# Patient Record
Sex: Male | Born: 1935 | Race: White | Hispanic: No | Marital: Married | Smoking: Former smoker
Health system: Southern US, Community
[De-identification: ages and names within clinical notes are randomized; demographics above are authoritative.]

## PROBLEM LIST (undated history)

## (undated) DIAGNOSIS — I2510 Atherosclerotic heart disease of native coronary artery without angina pectoris: Secondary | ICD-10-CM

## (undated) DIAGNOSIS — F0280 Dementia in other diseases classified elsewhere without behavioral disturbance: Secondary | ICD-10-CM

## (undated) DIAGNOSIS — K635 Polyp of colon: Secondary | ICD-10-CM

## (undated) DIAGNOSIS — K5730 Diverticulosis of large intestine without perforation or abscess without bleeding: Secondary | ICD-10-CM

## (undated) DIAGNOSIS — K922 Gastrointestinal hemorrhage, unspecified: Secondary | ICD-10-CM

## (undated) DIAGNOSIS — K648 Other hemorrhoids: Secondary | ICD-10-CM

## (undated) DIAGNOSIS — K5790 Diverticulosis of intestine, part unspecified, without perforation or abscess without bleeding: Secondary | ICD-10-CM

## (undated) DIAGNOSIS — I714 Abdominal aortic aneurysm, without rupture, unspecified: Secondary | ICD-10-CM

## (undated) DIAGNOSIS — N400 Enlarged prostate without lower urinary tract symptoms: Secondary | ICD-10-CM

## (undated) DIAGNOSIS — K219 Gastro-esophageal reflux disease without esophagitis: Secondary | ICD-10-CM

## (undated) DIAGNOSIS — K469 Unspecified abdominal hernia without obstruction or gangrene: Secondary | ICD-10-CM

## (undated) DIAGNOSIS — I509 Heart failure, unspecified: Secondary | ICD-10-CM

## (undated) DIAGNOSIS — I255 Ischemic cardiomyopathy: Secondary | ICD-10-CM

## (undated) DIAGNOSIS — G309 Alzheimer's disease, unspecified: Principal | ICD-10-CM

## (undated) DIAGNOSIS — E785 Hyperlipidemia, unspecified: Secondary | ICD-10-CM

## (undated) DIAGNOSIS — I459 Conduction disorder, unspecified: Secondary | ICD-10-CM

## (undated) DIAGNOSIS — C189 Malignant neoplasm of colon, unspecified: Secondary | ICD-10-CM

## (undated) HISTORY — DX: Hyperlipidemia, unspecified: E78.5

## (undated) HISTORY — DX: Other hemorrhoids: K64.8

## (undated) HISTORY — DX: Malignant neoplasm of colon, unspecified: C18.9

## (undated) HISTORY — PX: CHOLECYSTECTOMY: SHX55

## (undated) HISTORY — DX: Unspecified abdominal hernia without obstruction or gangrene: K46.9

## (undated) HISTORY — DX: Diverticulosis of intestine, part unspecified, without perforation or abscess without bleeding: K57.90

## (undated) HISTORY — DX: Atherosclerotic heart disease of native coronary artery without angina pectoris: I25.10

## (undated) HISTORY — DX: Ischemic cardiomyopathy: I25.5

## (undated) HISTORY — PX: OTHER SURGICAL HISTORY: SHX169

## (undated) HISTORY — DX: Alzheimer's disease, unspecified: G30.9

## (undated) HISTORY — DX: Polyp of colon: K63.5

## (undated) HISTORY — DX: Abdominal aortic aneurysm, without rupture, unspecified: I71.40

## (undated) HISTORY — DX: Heart failure, unspecified: I50.9

## (undated) HISTORY — DX: Conduction disorder, unspecified: I45.9

## (undated) HISTORY — PX: CARDIAC CATHETERIZATION: SHX172

## (undated) HISTORY — DX: Gastro-esophageal reflux disease without esophagitis: K21.9

## (undated) HISTORY — DX: Dementia in other diseases classified elsewhere, unspecified severity, without behavioral disturbance, psychotic disturbance, mood disturbance, and anxiety: F02.80

## (undated) HISTORY — DX: Benign prostatic hyperplasia without lower urinary tract symptoms: N40.0

## (undated) HISTORY — DX: Abdominal aortic aneurysm, without rupture: I71.4

## (undated) HISTORY — DX: Gastrointestinal hemorrhage, unspecified: K92.2

## (undated) HISTORY — DX: Diverticulosis of large intestine without perforation or abscess without bleeding: K57.30

## (undated) HISTORY — PX: INSERT / REPLACE / REMOVE PACEMAKER: SUR710

---

## 1991-12-13 DIAGNOSIS — C189 Malignant neoplasm of colon, unspecified: Secondary | ICD-10-CM

## 1991-12-13 HISTORY — DX: Malignant neoplasm of colon, unspecified: C18.9

## 1991-12-13 HISTORY — PX: COLON RESECTION: SHX5231

## 1995-12-13 HISTORY — PX: CORONARY ARTERY BYPASS GRAFT: SHX141

## 1996-02-10 HISTORY — PX: PACEMAKER INSERTION: SHX728

## 1999-10-06 ENCOUNTER — Encounter (INDEPENDENT_AMBULATORY_CARE_PROVIDER_SITE_OTHER): Payer: Self-pay | Admitting: Specialist

## 1999-10-06 ENCOUNTER — Other Ambulatory Visit: Admission: RE | Admit: 1999-10-06 | Discharge: 1999-10-06 | Payer: Self-pay | Admitting: Gastroenterology

## 1999-12-13 HISTORY — PX: OTHER SURGICAL HISTORY: SHX169

## 2003-03-13 HISTORY — PX: PROSTATE BIOPSY: SHX241

## 2004-11-05 ENCOUNTER — Ambulatory Visit: Payer: Self-pay

## 2004-12-16 ENCOUNTER — Ambulatory Visit: Payer: Self-pay

## 2005-01-04 ENCOUNTER — Ambulatory Visit: Payer: Self-pay

## 2005-02-02 ENCOUNTER — Ambulatory Visit: Payer: Self-pay | Admitting: Internal Medicine

## 2005-03-04 ENCOUNTER — Ambulatory Visit: Payer: Self-pay | Admitting: Internal Medicine

## 2005-03-21 ENCOUNTER — Ambulatory Visit: Payer: Self-pay

## 2005-04-01 ENCOUNTER — Ambulatory Visit: Payer: Self-pay | Admitting: Internal Medicine

## 2005-05-06 ENCOUNTER — Ambulatory Visit: Payer: Self-pay | Admitting: Internal Medicine

## 2005-05-12 HISTORY — PX: OTHER SURGICAL HISTORY: SHX169

## 2005-05-25 ENCOUNTER — Encounter: Payer: Self-pay | Admitting: Internal Medicine

## 2005-05-25 ENCOUNTER — Ambulatory Visit: Payer: Self-pay

## 2005-05-25 ENCOUNTER — Ambulatory Visit: Payer: Self-pay | Admitting: Internal Medicine

## 2005-05-31 ENCOUNTER — Inpatient Hospital Stay (HOSPITAL_COMMUNITY): Admission: RE | Admit: 2005-05-31 | Discharge: 2005-06-02 | Payer: Self-pay | Admitting: Internal Medicine

## 2005-06-01 ENCOUNTER — Ambulatory Visit: Payer: Self-pay | Admitting: *Deleted

## 2005-06-15 ENCOUNTER — Ambulatory Visit: Payer: Self-pay

## 2005-06-15 ENCOUNTER — Ambulatory Visit: Payer: Self-pay | Admitting: Internal Medicine

## 2005-09-27 ENCOUNTER — Ambulatory Visit: Payer: Self-pay | Admitting: Internal Medicine

## 2005-10-12 ENCOUNTER — Ambulatory Visit: Payer: Self-pay

## 2005-10-13 ENCOUNTER — Ambulatory Visit: Payer: Self-pay | Admitting: Internal Medicine

## 2005-11-11 ENCOUNTER — Ambulatory Visit: Payer: Self-pay | Admitting: Cardiovascular Disease

## 2005-12-27 ENCOUNTER — Ambulatory Visit: Payer: Self-pay | Admitting: Gastroenterology

## 2005-12-28 ENCOUNTER — Ambulatory Visit: Payer: Self-pay | Admitting: Internal Medicine

## 2006-01-18 ENCOUNTER — Encounter (INDEPENDENT_AMBULATORY_CARE_PROVIDER_SITE_OTHER): Payer: Self-pay | Admitting: Specialist

## 2006-01-18 ENCOUNTER — Ambulatory Visit: Payer: Self-pay | Admitting: Gastroenterology

## 2006-03-31 ENCOUNTER — Ambulatory Visit: Payer: Self-pay | Admitting: Internal Medicine

## 2006-04-14 ENCOUNTER — Ambulatory Visit: Payer: Self-pay | Admitting: Internal Medicine

## 2006-05-01 ENCOUNTER — Ambulatory Visit: Payer: Self-pay | Admitting: Internal Medicine

## 2006-05-04 ENCOUNTER — Ambulatory Visit: Payer: Self-pay | Admitting: Internal Medicine

## 2006-07-26 ENCOUNTER — Ambulatory Visit: Payer: Self-pay | Admitting: Internal Medicine

## 2006-08-11 ENCOUNTER — Ambulatory Visit: Payer: Self-pay

## 2006-08-17 ENCOUNTER — Ambulatory Visit: Payer: Self-pay | Admitting: Cardiology

## 2006-09-01 ENCOUNTER — Ambulatory Visit: Payer: Self-pay | Admitting: Internal Medicine

## 2006-11-11 HISTORY — PX: OTHER SURGICAL HISTORY: SHX169

## 2006-11-21 ENCOUNTER — Ambulatory Visit: Payer: Self-pay | Admitting: Internal Medicine

## 2006-11-30 ENCOUNTER — Ambulatory Visit: Payer: Self-pay

## 2006-12-03 IMAGING — CT CT CHEST W/O CM
1 series · 15 of 33 positions shown, 19 images · IV contrast (agent unspecified)
Comparison: none

CLINICAL DATA: Question right perihilar nodule on [REDACTED] chest x-ray 06/02/05.  Replacement coronary artery stent and AICD.  History of CAD/CABG. 
 CHEST CT WITHOUT CONTRAST:
TECHNIQUE: Multidetector CT imaging of the chest was performed following the standard protocol without IV contrast.

[Series 2: routine chest · axial · 0.74mm/px · z∈[-384,-110]mm · 15 of 65 slices shown, 19 images]
[im 5/65  mediastinal]
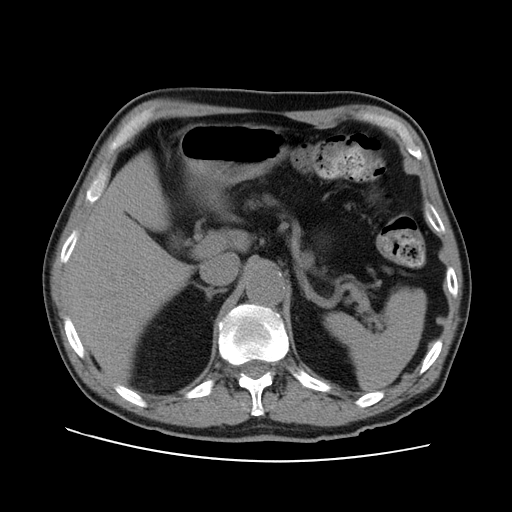
[im 5/65  lung]
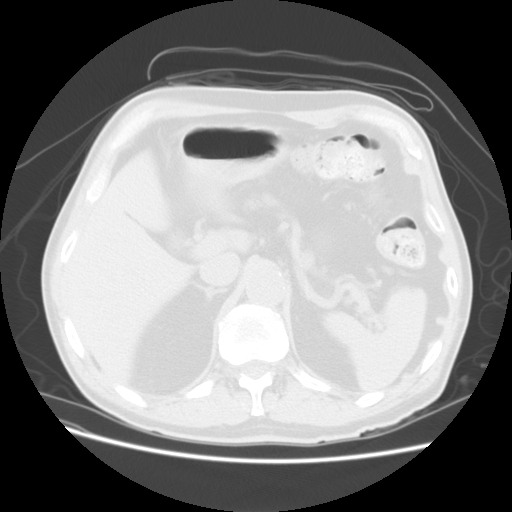
[im 10/65  lung]
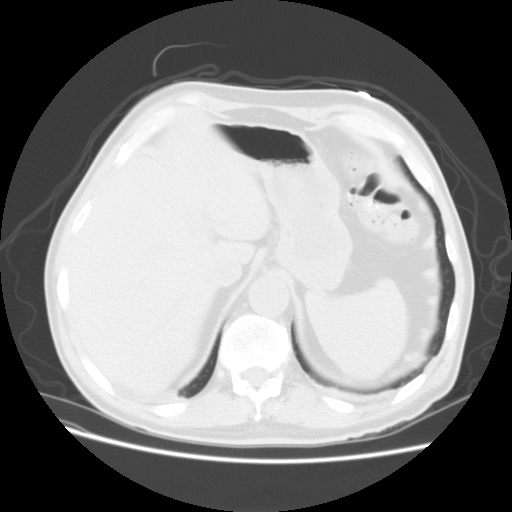
[im 13/65  lung]
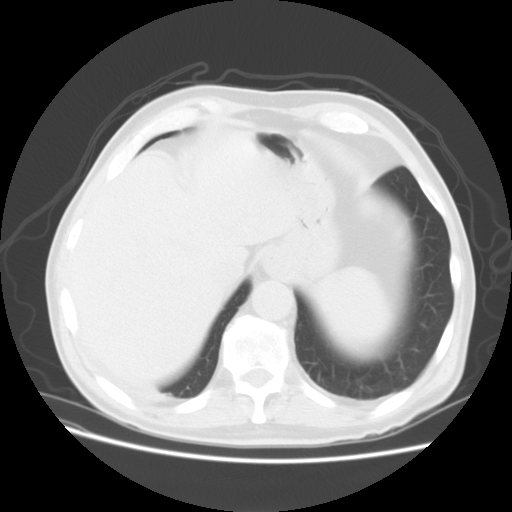
[im 17/65  lung]
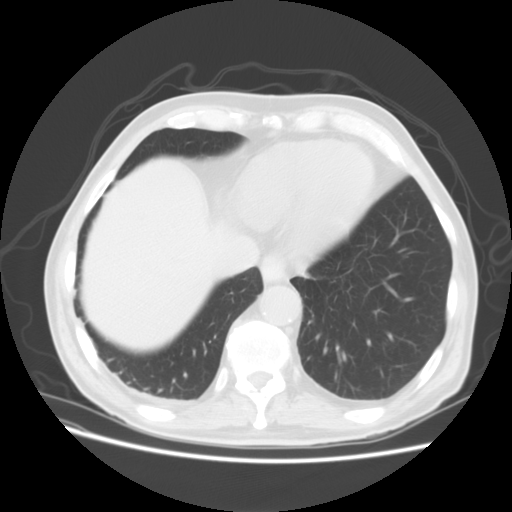
[im 22/65  mediastinal]
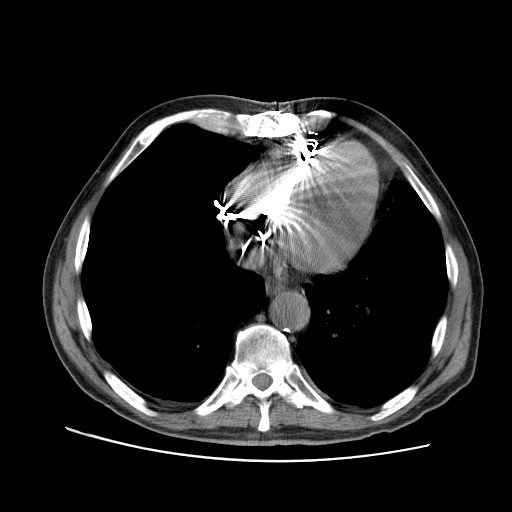
[im 22/65  lung]
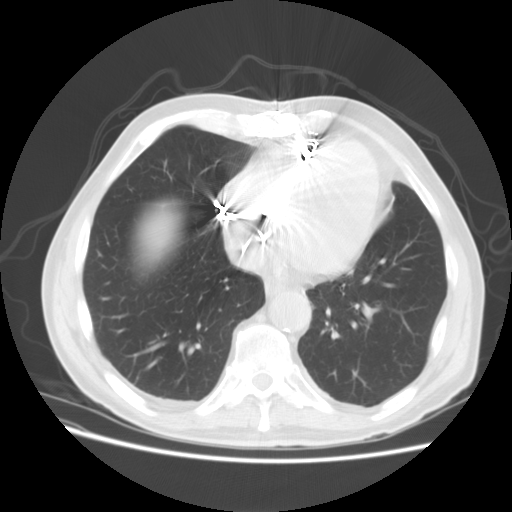
[im 26/65  lung]
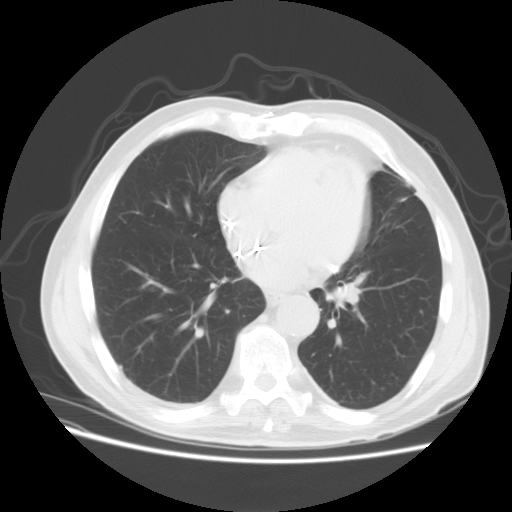
[im 29/65  lung]
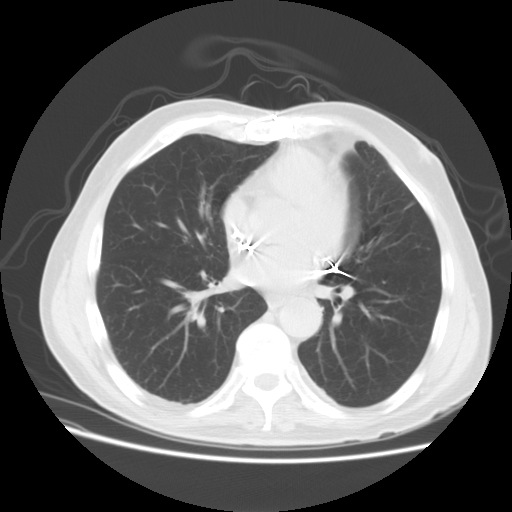
[im 34/65  lung]
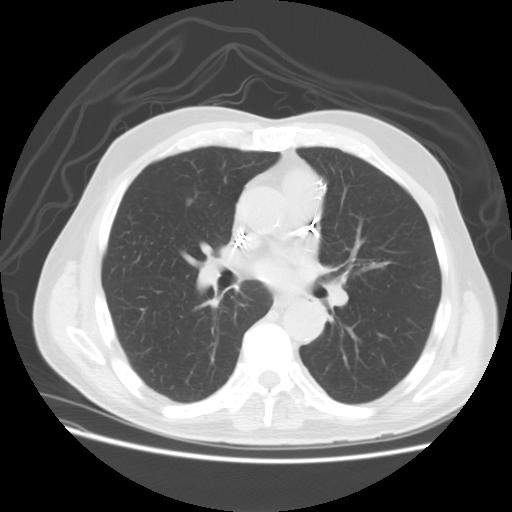
[im 36/65  mediastinal]
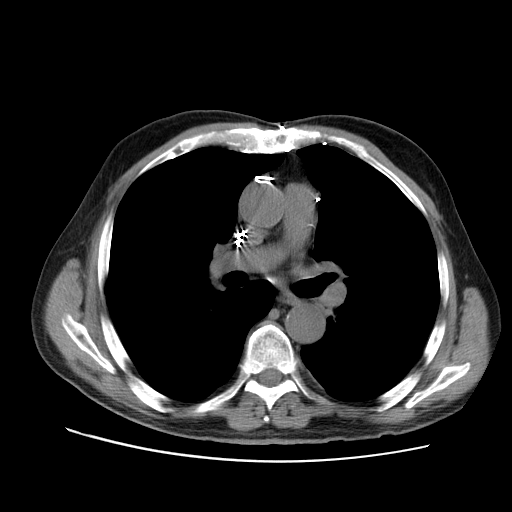
[im 36/65  lung]
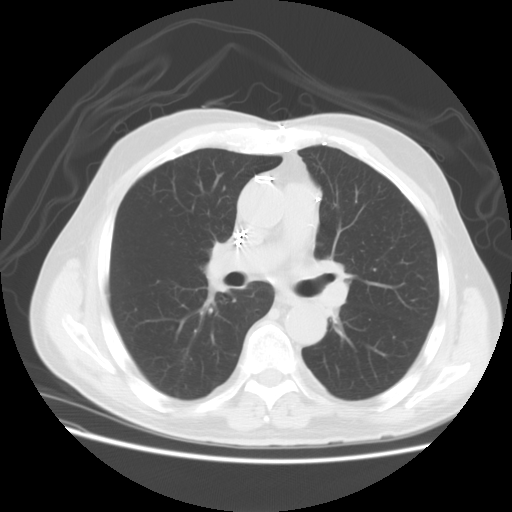
[im 39/65  lung]
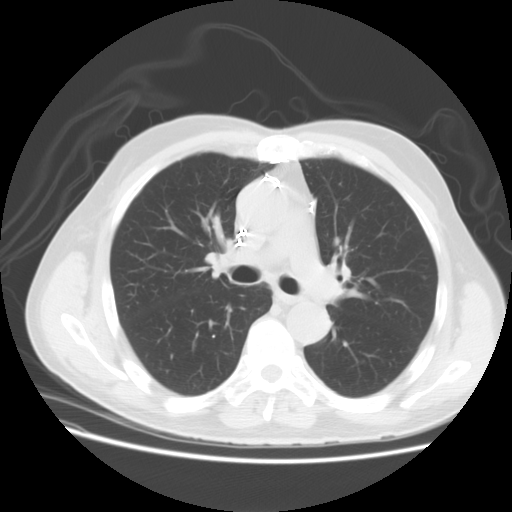
[im 43/65  lung]
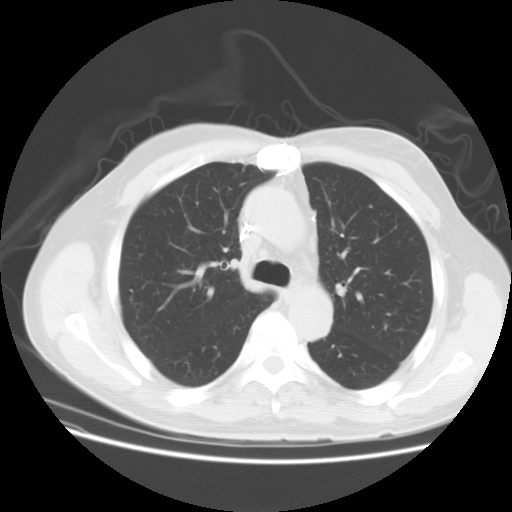
[im 48/65  lung]
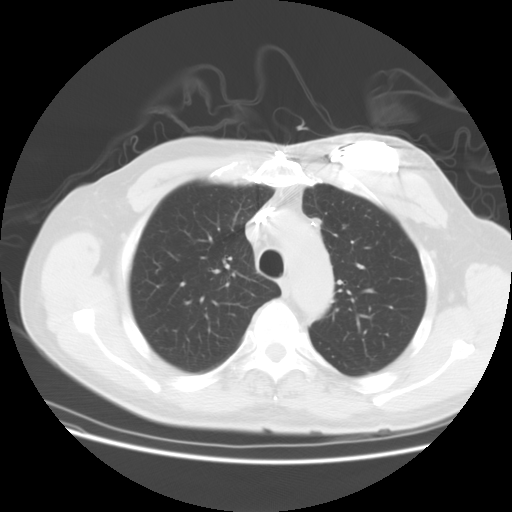
[im 52/65  mediastinal]
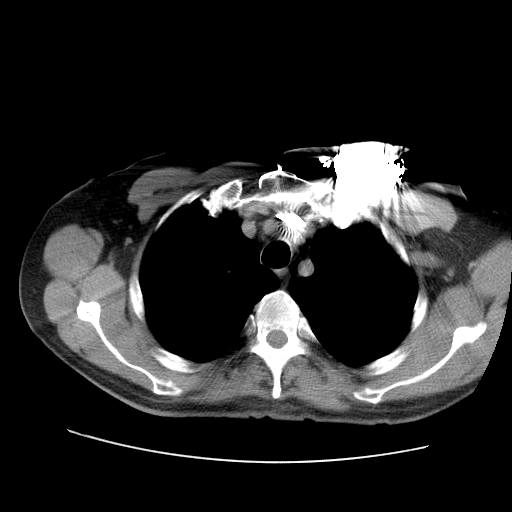
[im 52/65  lung]
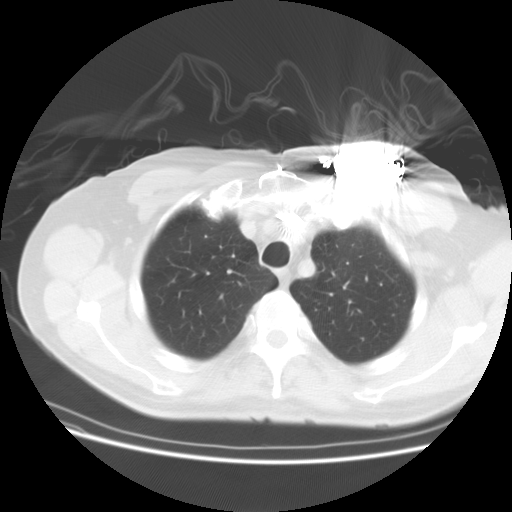
[im 55/65  lung]
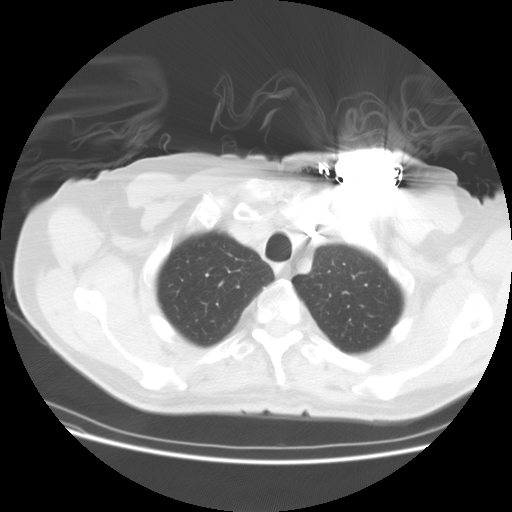
[im 60/65  lung]
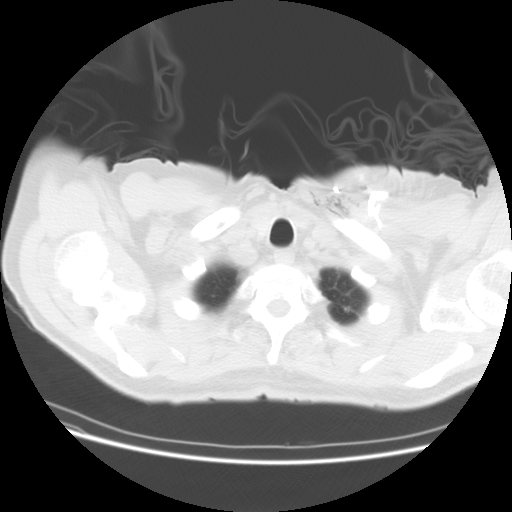

[15 of 33 positions shown; findings below may reference images not displayed]

FINDINGS: Multidetector spiral axial images were obtained through the thorax with no IV contrast and comparison made with [REDACTED] chest x-ray 06/02/05.  Since that study, a solitary ovoid 7 mm noncalcified nodule is seen along the level of the minor fissure at junction of right upper and middle lobes.  This could represent noncalcified pulmonary nodule or fissural lymph node.  Six-month follow-up chest CT, no IV contrast, is advised for further confirmation.  Lungs are otherwise clear.  No mediastinal, hilar, nor axillary mass/adenopathy is seen.  Stable heart size, coronary artery stent, post CABG changes, and permanent cardiac pacemaker/AICD leads are seen as in chest x-ray.  Minimal pleural thickening is seen at the bilateral lung bases.  Upper abdominal organs are unremarkable.
IMPRESSION: Since [REDACTED] chest x-ray, 06/02/05:
 1.  6 mm noncalcified nodule or fissural lymph node at the junction of right upper and middle lobes ? recommend limited chest CT, no IV contrast, in six months for further evaluation.
 2.  Post CABG, coronary arterial stent placement, and AICD/cardiac pacemaker leads.
 3.  Slight posterior pleural thickening with punctate benign-appearing calcifications (image #48).
 4.  11 mm likely low density adenoma in otherwise normal thyroid gland (image #4).

## 2006-12-12 HISTORY — PX: CORONARY ANGIOPLASTY: SHX604

## 2006-12-13 ENCOUNTER — Ambulatory Visit: Payer: Self-pay | Admitting: Internal Medicine

## 2006-12-14 ENCOUNTER — Inpatient Hospital Stay (HOSPITAL_BASED_OUTPATIENT_CLINIC_OR_DEPARTMENT_OTHER): Admission: RE | Admit: 2006-12-14 | Discharge: 2006-12-14 | Payer: Self-pay | Admitting: Cardiovascular Disease

## 2006-12-14 ENCOUNTER — Ambulatory Visit: Payer: Self-pay | Admitting: Cardiovascular Disease

## 2006-12-25 ENCOUNTER — Ambulatory Visit: Payer: Self-pay | Admitting: Internal Medicine

## 2007-04-02 ENCOUNTER — Ambulatory Visit: Payer: Self-pay

## 2007-04-26 DIAGNOSIS — N400 Enlarged prostate without lower urinary tract symptoms: Secondary | ICD-10-CM

## 2007-04-26 DIAGNOSIS — K219 Gastro-esophageal reflux disease without esophagitis: Secondary | ICD-10-CM

## 2007-04-26 DIAGNOSIS — I714 Abdominal aortic aneurysm, without rupture, unspecified: Secondary | ICD-10-CM | POA: Insufficient documentation

## 2007-04-26 DIAGNOSIS — K5730 Diverticulosis of large intestine without perforation or abscess without bleeding: Secondary | ICD-10-CM | POA: Insufficient documentation

## 2007-04-26 DIAGNOSIS — C180 Malignant neoplasm of cecum: Secondary | ICD-10-CM

## 2007-05-14 ENCOUNTER — Ambulatory Visit: Payer: Self-pay | Admitting: Internal Medicine

## 2007-05-14 DIAGNOSIS — E785 Hyperlipidemia, unspecified: Secondary | ICD-10-CM | POA: Insufficient documentation

## 2007-05-15 LAB — CONVERTED CEMR LAB
Albumin: 3.3 g/dL — ABNORMAL LOW (ref 3.5–5.2)
BUN: 12 mg/dL (ref 6–23)
CO2: 31 meq/L (ref 19–32)
Calcium: 8.9 mg/dL (ref 8.4–10.5)
Chloride: 104 meq/L (ref 96–112)
Cholesterol: 176 mg/dL (ref 0–200)
Creatinine, Ser: 1 mg/dL (ref 0.4–1.5)
GFR calc Af Amer: 94 mL/min
GFR calc non Af Amer: 78 mL/min
Glucose, Bld: 108 mg/dL — ABNORMAL HIGH (ref 70–99)
HDL: 28.8 mg/dL — ABNORMAL LOW (ref >39.0)
LDL Cholesterol: 128 mg/dL — ABNORMAL HIGH (ref 0–99)
PSA: 2.83 ng/mL (ref 0.10–4.00)
Phosphorus: 3.1 mg/dL (ref 2.3–4.6)
Potassium: 5 meq/L (ref 3.5–5.1)
Sodium: 136 meq/L (ref 135–145)
Total CHOL/HDL Ratio: 6.1
Triglycerides: 95 mg/dL (ref 0–149)
VLDL: 19 mg/dL (ref 0–40)

## 2007-07-02 ENCOUNTER — Ambulatory Visit: Payer: Self-pay | Admitting: Internal Medicine

## 2007-07-04 ENCOUNTER — Encounter: Payer: Self-pay | Admitting: Internal Medicine

## 2007-08-20 ENCOUNTER — Ambulatory Visit: Payer: Self-pay

## 2007-10-08 ENCOUNTER — Encounter: Payer: Self-pay | Admitting: Internal Medicine

## 2007-10-31 ENCOUNTER — Ambulatory Visit: Payer: Self-pay | Admitting: Family Medicine

## 2008-01-09 ENCOUNTER — Encounter: Payer: Self-pay | Admitting: Internal Medicine

## 2008-01-18 ENCOUNTER — Ambulatory Visit: Payer: Self-pay | Admitting: Internal Medicine

## 2008-04-16 ENCOUNTER — Encounter: Payer: Self-pay | Admitting: Internal Medicine

## 2008-05-12 ENCOUNTER — Ambulatory Visit: Payer: Self-pay | Admitting: Internal Medicine

## 2008-07-07 ENCOUNTER — Ambulatory Visit: Payer: Self-pay | Admitting: Internal Medicine

## 2008-07-09 LAB — CONVERTED CEMR LAB
ALT: 18 units/L (ref 0–53)
AST: 24 units/L (ref 0–37)
Albumin: 3.9 g/dL (ref 3.5–5.2)
Alkaline Phosphatase: 74 units/L (ref 39–117)
Basophils Relative: 0.4 % (ref 0.0–3.0)
Calcium: 9.3 mg/dL (ref 8.4–10.5)
Creatinine, Ser: 0.9 mg/dL (ref 0.4–1.5)
Eosinophils Relative: 1.6 % (ref 0.0–5.0)
GFR calc Af Amer: 106 mL/min
GFR calc non Af Amer: 88 mL/min
LDL Cholesterol: 130 mg/dL — ABNORMAL HIGH (ref 0–99)
Lymphocytes Relative: 21.4 % (ref 12.0–46.0)
Neutrophils Relative %: 66.2 % (ref 43.0–77.0)
Phosphorus: 3.7 mg/dL (ref 2.3–4.6)
RBC: 4.33 M/uL (ref 4.22–5.81)
Total Protein: 7.1 g/dL (ref 6.0–8.3)
VLDL: 21 mg/dL (ref 0–40)
WBC: 6.7 10*3/uL (ref 4.5–10.5)

## 2008-08-04 ENCOUNTER — Ambulatory Visit: Payer: Self-pay | Admitting: Internal Medicine

## 2008-08-22 ENCOUNTER — Ambulatory Visit: Payer: Self-pay

## 2008-10-09 ENCOUNTER — Telehealth: Payer: Self-pay | Admitting: Internal Medicine

## 2008-10-20 ENCOUNTER — Ambulatory Visit: Payer: Self-pay | Admitting: Internal Medicine

## 2008-10-29 ENCOUNTER — Encounter: Payer: Self-pay | Admitting: Internal Medicine

## 2008-10-31 ENCOUNTER — Ambulatory Visit: Payer: Self-pay | Admitting: Internal Medicine

## 2008-11-05 ENCOUNTER — Telehealth (INDEPENDENT_AMBULATORY_CARE_PROVIDER_SITE_OTHER): Payer: Self-pay | Admitting: Internal Medicine

## 2009-01-19 ENCOUNTER — Ambulatory Visit: Payer: Self-pay | Admitting: Internal Medicine

## 2009-03-09 ENCOUNTER — Telehealth (INDEPENDENT_AMBULATORY_CARE_PROVIDER_SITE_OTHER): Payer: Self-pay | Admitting: *Deleted

## 2009-03-10 ENCOUNTER — Ambulatory Visit: Payer: Self-pay

## 2009-03-10 ENCOUNTER — Encounter: Payer: Self-pay | Admitting: Cardiology

## 2009-04-27 ENCOUNTER — Ambulatory Visit: Payer: Self-pay | Admitting: Internal Medicine

## 2009-04-27 ENCOUNTER — Encounter: Payer: Self-pay | Admitting: Internal Medicine

## 2009-04-29 ENCOUNTER — Encounter: Payer: Self-pay | Admitting: Internal Medicine

## 2009-05-06 ENCOUNTER — Ambulatory Visit: Payer: Self-pay | Admitting: Internal Medicine

## 2009-05-06 ENCOUNTER — Encounter: Payer: Self-pay | Admitting: Internal Medicine

## 2009-05-15 ENCOUNTER — Telehealth: Payer: Self-pay | Admitting: Internal Medicine

## 2009-05-19 ENCOUNTER — Telehealth: Payer: Self-pay | Admitting: Internal Medicine

## 2009-06-09 ENCOUNTER — Telehealth: Payer: Self-pay | Admitting: Internal Medicine

## 2009-08-10 ENCOUNTER — Ambulatory Visit: Payer: Self-pay | Admitting: Internal Medicine

## 2009-08-10 DIAGNOSIS — I442 Atrioventricular block, complete: Secondary | ICD-10-CM

## 2009-08-24 ENCOUNTER — Telehealth: Payer: Self-pay | Admitting: Internal Medicine

## 2009-08-26 ENCOUNTER — Ambulatory Visit: Payer: Self-pay

## 2009-08-26 ENCOUNTER — Encounter: Payer: Self-pay | Admitting: Internal Medicine

## 2009-09-03 ENCOUNTER — Ambulatory Visit: Payer: Self-pay | Admitting: Internal Medicine

## 2009-09-04 LAB — CONVERTED CEMR LAB
Albumin: 3.8 g/dL (ref 3.5–5.2)
Basophils Relative: 0.6 % (ref 0.0–3.0)
Bilirubin, Direct: 0 mg/dL (ref 0.0–0.3)
Calcium: 9.5 mg/dL (ref 8.4–10.5)
Eosinophils Absolute: 0.1 10*3/uL (ref 0.0–0.7)
Glucose, Bld: 81 mg/dL (ref 70–99)
MCHC: 34.5 g/dL (ref 30.0–36.0)
MCV: 98.2 fL (ref 78.0–100.0)
Monocytes Absolute: 0.8 10*3/uL (ref 0.1–1.0)
Neutro Abs: 3.9 10*3/uL (ref 1.4–7.7)
Neutrophils Relative %: 60.7 % (ref 43.0–77.0)
Potassium: 4.8 meq/L (ref 3.5–5.1)
RBC: 4.45 M/uL (ref 4.22–5.81)
TSH: 0.48 microintl units/mL (ref 0.35–5.50)
Total Protein: 6.9 g/dL (ref 6.0–8.3)
Vitamin B-12: 330 pg/mL (ref 211–911)

## 2009-09-08 ENCOUNTER — Telehealth: Payer: Self-pay | Admitting: Internal Medicine

## 2009-09-08 ENCOUNTER — Telehealth (INDEPENDENT_AMBULATORY_CARE_PROVIDER_SITE_OTHER): Payer: Self-pay | Admitting: *Deleted

## 2009-09-18 ENCOUNTER — Ambulatory Visit: Payer: Self-pay | Admitting: Internal Medicine

## 2009-10-19 ENCOUNTER — Ambulatory Visit: Payer: Self-pay | Admitting: Internal Medicine

## 2009-11-09 ENCOUNTER — Ambulatory Visit: Payer: Self-pay | Admitting: Internal Medicine

## 2009-11-17 ENCOUNTER — Ambulatory Visit: Payer: Self-pay | Admitting: Internal Medicine

## 2009-12-21 ENCOUNTER — Telehealth: Payer: Self-pay | Admitting: Internal Medicine

## 2010-01-06 ENCOUNTER — Telehealth: Payer: Self-pay | Admitting: Internal Medicine

## 2010-01-22 ENCOUNTER — Ambulatory Visit: Payer: Self-pay | Admitting: Internal Medicine

## 2010-03-01 ENCOUNTER — Ambulatory Visit: Payer: Self-pay | Admitting: Internal Medicine

## 2010-03-03 ENCOUNTER — Encounter: Payer: Self-pay | Admitting: Internal Medicine

## 2010-03-04 ENCOUNTER — Telehealth (INDEPENDENT_AMBULATORY_CARE_PROVIDER_SITE_OTHER): Payer: Self-pay | Admitting: *Deleted

## 2010-03-08 ENCOUNTER — Ambulatory Visit: Payer: Self-pay | Admitting: Internal Medicine

## 2010-03-08 ENCOUNTER — Ambulatory Visit: Payer: Self-pay

## 2010-03-08 ENCOUNTER — Encounter (HOSPITAL_COMMUNITY): Admission: RE | Admit: 2010-03-08 | Discharge: 2010-05-12 | Payer: Self-pay | Admitting: Internal Medicine

## 2010-05-05 ENCOUNTER — Encounter: Payer: Self-pay | Admitting: Internal Medicine

## 2010-06-28 ENCOUNTER — Ambulatory Visit: Payer: Self-pay | Admitting: Internal Medicine

## 2010-07-05 ENCOUNTER — Encounter: Payer: Self-pay | Admitting: Internal Medicine

## 2010-07-21 ENCOUNTER — Ambulatory Visit: Payer: Self-pay | Admitting: Internal Medicine

## 2010-07-21 DIAGNOSIS — H9190 Unspecified hearing loss, unspecified ear: Secondary | ICD-10-CM | POA: Insufficient documentation

## 2010-08-31 ENCOUNTER — Encounter: Payer: Self-pay | Admitting: Internal Medicine

## 2010-09-01 ENCOUNTER — Encounter: Payer: Self-pay | Admitting: Internal Medicine

## 2010-09-01 ENCOUNTER — Ambulatory Visit: Payer: Self-pay

## 2010-09-23 ENCOUNTER — Ambulatory Visit: Payer: Self-pay | Admitting: Internal Medicine

## 2010-11-16 ENCOUNTER — Ambulatory Visit: Payer: Self-pay | Admitting: Internal Medicine

## 2010-11-18 ENCOUNTER — Encounter: Payer: Self-pay | Admitting: Internal Medicine

## 2010-11-18 ENCOUNTER — Emergency Department: Payer: PRIVATE HEALTH INSURANCE | Admitting: Emergency Medicine

## 2010-11-19 ENCOUNTER — Telehealth (INDEPENDENT_AMBULATORY_CARE_PROVIDER_SITE_OTHER): Payer: Self-pay | Admitting: *Deleted

## 2010-11-19 ENCOUNTER — Ambulatory Visit: Payer: Self-pay | Admitting: Internal Medicine

## 2010-11-19 DIAGNOSIS — K625 Hemorrhage of anus and rectum: Secondary | ICD-10-CM

## 2010-11-22 ENCOUNTER — Ambulatory Visit: Payer: Self-pay | Admitting: Internal Medicine

## 2010-11-22 ENCOUNTER — Telehealth: Payer: Self-pay | Admitting: Gastroenterology

## 2010-11-22 LAB — CONVERTED CEMR LAB
Basophils Relative: 0.4 % (ref 0.0–3.0)
Eosinophils Absolute: 0.1 10*3/uL (ref 0.0–0.7)
Eosinophils Relative: 1.8 % (ref 0.0–5.0)
Hemoglobin: 14.6 g/dL (ref 13.0–17.0)
Lymphocytes Relative: 16.2 % (ref 12.0–46.0)
Monocytes Relative: 10.8 % (ref 3.0–12.0)
Neutrophils Relative %: 70.8 % (ref 43.0–77.0)
RBC: 4.21 M/uL — ABNORMAL LOW (ref 4.22–5.81)
WBC: 6.8 10*3/uL (ref 4.5–10.5)

## 2010-11-23 ENCOUNTER — Ambulatory Visit: Payer: Self-pay | Admitting: Gastroenterology

## 2010-11-23 DIAGNOSIS — I2510 Atherosclerotic heart disease of native coronary artery without angina pectoris: Secondary | ICD-10-CM | POA: Insufficient documentation

## 2010-11-23 DIAGNOSIS — I10 Essential (primary) hypertension: Secondary | ICD-10-CM | POA: Insufficient documentation

## 2010-11-23 DIAGNOSIS — I509 Heart failure, unspecified: Secondary | ICD-10-CM | POA: Insufficient documentation

## 2011-01-02 ENCOUNTER — Encounter: Payer: Self-pay | Admitting: Internal Medicine

## 2011-01-03 ENCOUNTER — Encounter (INDEPENDENT_AMBULATORY_CARE_PROVIDER_SITE_OTHER): Payer: Self-pay

## 2011-01-05 ENCOUNTER — Ambulatory Visit
Admission: RE | Admit: 2011-01-05 | Discharge: 2011-01-05 | Payer: Self-pay | Source: Home / Self Care | Attending: Gastroenterology | Admitting: Gastroenterology

## 2011-01-11 NOTE — Assessment & Plan Note (Signed)
Summary: ER FOLLOW UP- per Dr.Letvak  [facility]/ lb   Vital Signs:  Patient profile:   77 year old male Weight:      150 pounds O2 Sat:      100 % on Room air Temp:     98.9 degrees F oral Pulse rate:   65 / minute Pulse rhythm:   regular BP sitting:   122 / 62  (left arm) Cuff size:   large  Vitals Entered By: Mervin Hack CMA Duncan Dull) (November 19, 2010 12:51 PM)  O2 Flow:  Room air CC: ER follow-up   History of Present Illness: Went to bathroom yesterday and moved bowels as he was wiping, he found bright red blood in bowl and on toilet paper he was dripping blood ----wife by then was there and she witnessed Went straight to ER and was still bleeding  Seen there Reports reviewed did have blood clots on exam  Did have repeat exam about 2 hours later and no blood and stool was even brown Hgb went from 14.0 to 13.1 over that 2 hours  No stomach pain  No history of diverticulosis  Allergies: 1)  ! Lipitor 2)  ! Zocor  Past History:  Past medical, surgical, family and social histories (including risk factors) reviewed for relevance to current acute and chronic problems.  Past Medical History: Reviewed history from 10/19/2009 and no changes required. Congestive heart failure Coronary artery disease Diverticulosis, colon GERD Benign prostatic hypertrophy--------------------------------------Dr Cope Heart block--pacer/defib 6/06--------------------------------------Dr Graciela Husbands CHF AAA Hyperlipidemia Colon cancer, hx of Mild cognitive impairment  Past Surgical History: Reviewed history from 09/03/2009 and no changes required. CABG  1997 RESECTION R COLON  1993 RECURRANT HYPERPLASTIC POLYPS Cholecystectomy CARDILITE  NEGATIVE  1/01 PACEMAKER 02/1996 PROSTATE BX  NEGITIVE  03/2003 ADENSONE STRESS- FIXED AMT DEFECT LV 30% (DILATED)  05/2005 PACER /ICD (TYLER) 05/2005 - ST Jude Atlas Plus ADENOSOINE-MYOVIEW- LOW RISK 11/2006 CATH --NO SIG PROBLEMS  12/2006  Family  History: Reviewed history from 09/03/2009 and no changes required. Mom and Dad died of old age Sister died of insulin reaction. 2 brothers--also deceased    Social History: Reviewed history from 09/03/2009 and no changes required. 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  NAD Neck:  supple, no masses, no thyromegaly, and no cervical lymphadenopathy.   Lungs:  normal respiratory effort, no intercostal retractions, no accessory muscle use, and normal breath sounds.   Heart:  normal rate, regular rhythm, no murmur, and no gallop.   Abdomen:  soft, non-tender, and no masses.   Rectal:  small non thrombosed ext hemorrhoid very slight irritation near that No masses Brown heme negative stool in vault   Impression & Recommendations:  Problem # 1:  RECTAL BLEEDING (ICD-569.3) Assessment New significant bleeding for over 30 minutes with blood clots on exam in ER Then heme negative stool no recurrence and stool still heme negative No external lesions to account for bleeding (and had clots in vault) this suggests a distal colonic lesion----diverticulosis most likely though none on colon in 2004  P: stay off aspirin and plavix    recheck 3-4 days    ER if recurrent bleeding    will plan on GI eval  Complete Medication List: 1)  Nadolol 20 Mg Tabs (Nadolol) .Marland Kitchen.. 1 once daily 2)  Aspir-low 81 Mg Tbec (Aspirin) .Marland Kitchen.. 1 once daily 3)  Plavix 75 Mg Tabs (Clopidogrel bisulfate) .... Take one by mouth daily 4)  Multivitamins Tabs (Multiple vitamin) .Marland Kitchen.. 1 daily 5)  Avodart 0.5 Mg Caps (Dutasteride) .Marland Kitchen.. 1 tab once daily  Patient Instructions: 1)  Please stay off the aspirin and plavix till your appt next week 2)  Please set up a follow up for Monday or Tuesday   Orders Added: 1)  Est. Patient Level IV  [16109]    Current Allergies (reviewed today): ! LIPITOR ! ZOCOR

## 2011-01-11 NOTE — Assessment & Plan Note (Signed)
Summary: F/U W/ DEVICE CHECK      Allergies Added:   Visit Type:  Follow-up Primary Provider:  Cindee Salt MD  CC:  no complaints and sometimes have little tingling in arm.  History of Present Illness: [name] Rich is seen in followup for complete heart block ischemic heart disease with prior bypass surgery and an implanted ICD for primary prevention. He has had intercurrent normalization of left ventricular systolic function. Her breath he comes in today complaining of new onset exertional left arm discomfort. Is not associated with radiation shortness of breath diaphoresis or nausea. It is relieved by rest.  Review of his prior data demonstrates a low risk Myoview in 2007.He underwent catheterization following that Myoview which demonstrated patent grafts..  His bypass was in 1997.    Current Problems (verified): 1)  Mild Cognitive Impairment So Stated  (ICD-331.83) 2)  Dementia  (ICD-294.8) 3)  Implantable Defibrillator St J  (ICD-V45.02) 4)  Cardiomyopathy, Ischemic S/p Cabg Nl Ef  (ICD-414.8) 5)  Av Block, Complete  (ICD-426.0) 6)  Hyperlipidemia  (ICD-272.4) 7)  Abdominal Aortic Aneurysm  (ICD-441.4) 8)  Adenocarcinoma, Colon, Cecum  (ICD-153.4) 9)  Benign Prostatic Hypertrophy  (ICD-600.00) 10)  Gerd  (ICD-530.81) 11)  Diverticulosis, Colon  (ICD-562.10) 12)  Colon Cancer, Hx of  (ICD-V10.05) 13)  Screening For Malignant Neoplasm, Prostate  (ICD-V76.44)  Current Medications (verified): 1)  Nadolol 20 Mg Tabs (Nadolol) .Marland Kitchen.. 1 Once Daily 2)  Aspir-Low 81 Mg Tbec (Aspirin) .Marland Kitchen.. 1 Once Daily 3)  Plavix 75 Mg Tabs (Clopidogrel Bisulfate) .Marland Kitchen.. 1 Daily 4)  Multivitamins  Tabs (Multiple Vitamin) .Marland Kitchen.. 1 Daily 5)  Avodart 0.5 Mg  Caps (Dutasteride) .... As Directed  Allergies (verified): 1)  ! Lipitor 2)  ! Zocor  Past History:  Past Medical History: Last updated: 10/19/2009 Congestive heart failure Coronary artery disease Diverticulosis, colon GERD Benign  prostatic hypertrophy--------------------------------------Dr Cope Heart block--pacer/defib 6/06--------------------------------------Dr Graciela Husbands CHF AAA Hyperlipidemia Colon cancer, hx of Mild cognitive impairment  Past Surgical History: Last updated: Sep 20, 2009 CABG  1997 RESECTION R COLON  1993 RECURRANT HYPERPLASTIC POLYPS Cholecystectomy CARDILITE  NEGATIVE  1/01 PACEMAKER 02/1996 PROSTATE BX  NEGITIVE  03/2003 ADENSONE STRESS- FIXED AMT DEFECT LV 30% (DILATED)  05/2005 PACER /ICD (TYLER) 05/2005 - ST Jude Atlas Plus ADENOSOINE-MYOVIEW- LOW RISK 11/2006 CATH --NO SIG PROBLEMS  12/2006  Family History: Last updated: September 20, 2009 Mom and Dad died of old age Sister died of insulin reaction. 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 Head:  normal HEENT Neck:  supple. Lungs:  Clear bilaterally to auscultation and percussion. Heart:  regular rate and rhythm with a diminished S1 prominent S4 and a 2/6 murmur heard along theleft sternal border Abdomen:  soft nontender with active bowel sounds Msk:  Back normal, normal gait. Muscle strength and tone normal. Pulses:  pulses normal  in all 4 extremities Neurologic:  alert and oriented but could not remember instructions from 10 minutes before    ICD Specifications Following MD:  Sherryl Manges, MD     ICD Vendor:  [facility] Jude     ICD Model Number:  [id number]     ICD Serial Number:   [id number] ICD DOI:  06/01/2005     ICD Implanting MD:  Sherryl Manges, MD  Lead 1:    Location: RA     DOI: 02/20/1996     Model #: [id number]     Serial #: [id number]     Status: active Lead 2:    Location: RV     DOI: 06/01/2005     Model #: [id number]     Serial #: [id number]     Status: active  Indications::  ICM  Explantation Comments: Pacemaker dependent  ICD Follow Up Remote Check?  No Battery Voltage:  2.58 V     Charge Time:  12 seconds     Underlying rhythm:  dependent ICD Dependent:  Yes       ICD Device Measurements Atrium:  Amplitude: 0.9 mV, Impedance: 280 ohms, Threshold: 1.0 V at 0.6 msec Right Ventricle:  Impedance: 580 ohms, Threshold: 0.5 V at 0.5 msec  Episodes MS Episodes:  268     Percent Mode Switch:  <1%     Coumadin:  No Shock:  0     ATP:  0     Nonsustained:  0     Atrial Pacing:  44%     Ventricular Pacing:  100%  Brady Parameters Mode DDDR     Lower Rate Limit:  60     Upper Rate Limit 120 PAV 170     Sensed AV Delay:  150  Tachy Zones VF:  200     VT:  164     Next Cardiology Appt Due:  05/12/2010 Tech Comments:  No parameter changes.  Device function normal.  ROV 3 months with Dr. Graciela Husbands in [place]. Altha Harm, LPN  March 01, 2010 9:42 AM   Impression & Recommendations:  Problem # 1:  CHEST PAIN (ICD-786.50) [name] has exertional left arm discomfort. While this is quite atypical, he is now 14 years post graft placement. He had negative catheterization about 3 years ago and had a Myoview prior to that. We will repeat a Myoview scan and we'll need to compare it carefully to the previous scan to look for evidence of new ischemia.  No medication changes will be made at His updated medication list for this problem includes:    Nadolol 20 Mg Tabs (Nadolol) .Marland Kitchen... 1 once daily    Aspir-low 81 Mg Tbec (Aspirin) .Marland Kitchen... 1 once daily    Plavix 75 Mg Tabs (Clopidogrel bisulfate) .Marland Kitchen... 1 daily  Problem # 2:  IMPLANTABLE  DEFIBRILLATOR  ST J (ICD-V45.02) Device parameters  and data were reviewed and no changes were made  Problem # 3:  CARDIOMYOPATHY, ISCHEMIC S/P CABG NL EF (ICD-414.8) as above  Problem # 4:  AV BLOCK, COMPLETE (ICD-426.0) stable post pacemaker implantation His updated medication list for this problem includes:    Nadolol 20 Mg Tabs (Nadolol) .Marland Kitchen... 1 once daily    Aspir-low 81 Mg Tbec (Aspirin) .Marland Kitchen... 1 once daily    Plavix 75 Mg Tabs (Clopidogrel bisulfate) .Marland Kitchen... 1 daily

## 2011-01-11 NOTE — Assessment & Plan Note (Signed)
Summary: Cardiology Nuclear Study  Nuclear Med Background Indications for Stress Test: Evaluation for Ischemia, Graft Patency, Stent Patency   History: Abnormal EKG, CABG, Defibrillator, Heart Catheterization, Myocardial Perfusion Study, Pacemaker, Stents  History Comments: '97 CABGx3 '06 Defibrillator (ICM) '06 STENTS SVG-OM2 '07 Myoview Perfusion Study: AntApical Scar with mild P-I Ischemia EF 57% '08 Heart Catherization: Patent Grafts/Stents EF 45-50%   Symptoms Comments: 02/10 L. Arm Pain while shoveling snow.   Nuclear Pre-Procedure Cardiac Risk Factors: Family History - CAD, History of Smoking, LBBB Caffeine/Decaff Intake: None NPO After: 5:00 PM Lungs: clear IV 0.9% NS with Angio Cath: 20g     IV Site: (R) AC IV Started by: Irean Hong RN Chest Size (in) 40     Height (in): 71 Weight (lb): 151 BMI: 21.14  Nuclear Med Study 1 or 2 day study:  1 day     Stress Test Type:  Adenosine Reading MD:  Dietrich Pates, MD     Referring MD:  S.Klein Resting Radionuclide:  Technetium 53m Tetrofosmin     Resting Radionuclide Dose:  11 mCi  Stress Radionuclide:  Technetium 24m Tetrofosmin     Stress Radionuclide Dose:  33 mCi   Stress Protocol  Dose of Adenosine:  38.4 mg    Stress Test Technologist:  Milana Na EMT-P     Nuclear Technologist:  Domenic Polite CNMT  Rest Procedure  Myocardial perfusion imaging was performed at rest 45 minutes following the intravenous administration of Myoview Technetium 66m Tetrofosmin.  Stress Procedure  The patient received IV adenosine at 140 mcg/kg/min for 4 minutes. There were no significant changes, + sob, and chest tightness with infusion. Myoview was injected at the 2 minute mark and quantitative spect images were obtained after a 45 minute delay.  QPS Raw Data Images:  Soft tissue (diaprhragm, bowel activity) underlie heart. Stress Images:  Mild thinning in the inferoseptum (base, mid, miniamlly distal).  Apical thinning   Otherwise normal perfusion. Rest Images:  No significant change from the stress images. Transient Ischemic Dilatation:  1.05  (Normal <1.22)  Lung/Heart Ratio:  .25  (Normal <0.45)  Quantitative Gated Spect Images QGS EDV:  91 ml QGS ESV:  43 ml QGS EF:  53 %   Overall Impression  Exercise Capacity: Adenosine study with no exercise. BP Response: Normal blood pressure response. Clinical Symptoms: Chest tightness ECG Impression: Nondiagnostic because of baseline changes. Overall Impression Comments: Myoview scan with inferoseptal and apical changes consistent with mild scar.  NO evidence for significant ischemia.  LVEF 53% with septal and apical hypkinesis.    Appended Document: Cardiology Nuclear Study great news  Appended Document: Cardiology Nuclear Study Pt notified.

## 2011-01-11 NOTE — Miscellaneous (Signed)
Summary: Plavix  Clinical Lists Changes  Medications: Changed medication from PLAVIX 75 MG TABS (CLOPIDOGREL BISULFATE) 1 daily to PLAVIX 75 MG TABS (CLOPIDOGREL BISULFATE) 1 daily - Signed Rx of PLAVIX 75 MG TABS (CLOPIDOGREL BISULFATE) 1 daily;  #90 x 4;  Signed;  Entered by: Benedict Needy, RN;  Authorized by: Nathen May, MD, [facility];  Method used: Electronically to [facility] [phone]*, [address] [place], [place], [place]  [address], Ph: [phone] or [phone], Fax: [phone]    Prescriptions: PLAVIX 75 MG TABS (CLOPIDOGREL BISULFATE) 1 daily  #90 x 4   Entered by:   Benedict Needy, RN   Authorized by:   Nathen May, MD, [facility]   Signed by:   Benedict Needy, RN on 07/05/2010   Method used:   Electronically to        [facility] [phone]* (retail)       [address] [place], [place]  [address]       Ph: [phone] or [phone]       Fax: [phone]   RxID:   [id number]

## 2011-01-11 NOTE — Assessment & Plan Note (Signed)
Summary: 6 M F/U DLO   Vital Signs:  Patient profile:   77 year old male Weight:      147.50 pounds Temp:     98.2 degrees F oral Pulse rate:   72 / minute Pulse rhythm:   regular Resp:     14 per minute BP sitting:   120 / 64  (left arm) Cuff size:   regular  Vitals Entered By: Sydell Axon LPN (July 21, 2010 10:29 AM) CC: 6 Month follow-up   History of Present Illness: Doing okay Has not been golfing due to the heat Walks almost every day  Memory problems are about the same Wonders about a hearing aide--hearing seems worse Still handles financial affairs No change in function since last visit  Mild cough with the hot weather occ in the house usually better after drinking water also similar to time when antibiotic helped  No problems with heart No SOb No chest pain  Preventive Screening-Counseling & Management  Alcohol-Tobacco     Smoking Status: quit > 6 months     Smoking Cessation Counseling: no  Allergies: 1)  ! Lipitor 2)  ! Zocor  Past History:  Past medical, surgical, family and social histories (including risk factors) reviewed for relevance to current acute and chronic problems.  Past Medical History: Reviewed history from 10/19/2009 and no changes required. Congestive heart failure Coronary artery disease Diverticulosis, colon GERD Benign prostatic hypertrophy--------------------------------------Dr Cope Heart block--pacer/defib 6/06--------------------------------------Dr Graciela Husbands CHF AAA Hyperlipidemia Colon cancer, hx of Mild cognitive impairment  Past Surgical History: Reviewed history from 09/03/2009 and no changes required. CABG  1997 RESECTION R COLON  1993 RECURRANT HYPERPLASTIC POLYPS Cholecystectomy CARDILITE  NEGATIVE  1/01 PACEMAKER 02/1996 PROSTATE BX  NEGITIVE  03/2003 ADENSONE STRESS- FIXED AMT DEFECT LV 30% (DILATED)  05/2005 PACER /ICD (TYLER) 05/2005 - ST Jude Atlas Plus ADENOSOINE-MYOVIEW- LOW RISK 11/2006 CATH  --NO SIG PROBLEMS  12/2006  Family History: Reviewed history from 09/03/2009 and no changes required. Mom and Dad died of old age Sister died of insulin reaction. 2 brothers--also deceased    Social History: Reviewed history from 09/03/2009 and no changes required. 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   Head:  normocephalic and atraumatic.   Ears:  R ear normal and L ear normal.   Nose:  no nasal discharge and no mucosal edema.   Mouth:  no erythema and no exudates.   Neck:  supple, no masses, and no thyromegaly.   Lungs:  normal respiratory effort, no intercostal retractions, no accessory muscle use, and normal breath sounds.   Heart:  normal rate, regular rhythm, no murmur, and no gallop.   Extremities:  no sig edema Neurologic:  appropriate interaction did drop some sentences without realizing Psych:  normally interactive, good eye contact, not anxious appearing, and not depressed appearing.     Impression & Recommendations:  Problem # 1:  COUGH (ICD-786.2) Assessment New CXR looks okay likely allergy related will have him try antihistamine  Orders: T-2 View CXR ([id number])  Problem # 2:  MILD COGNITIVE IMPAIRMENT SO STATED (ICD-331.83) Assessment: Unchanged no sig changes  no Rx for now  Problem # 3:  HYPERLIPIDEMIA (ICD-272.4) Assessment: Unchanged No Rx since seemed to have cognitive changes  with statin  Labs Reviewed: SGOT: 23 (09/03/2009)   SGPT: 20 (09/03/2009)   HDL:39.7 (07/07/2008), 28.8 (05/14/2007)  LDL:130 (07/07/2008), 128 (05/14/2007)  Chol:191 (07/07/2008), 176 (05/14/2007)  Trig:106 (07/07/2008), 95 (05/14/2007)  Problem # 4:  BENIGN PROSTATIC HYPERTROPHY (ICD-600.00) Assessment: Comment Only doing well with  avodart PSA down Sees urologist  Problem # 5:  HEARING LOSS (ICD-389.9) Assessment: Deteriorated will set up eval Orders: ENT Referral (ENT)  Complete Medication List: 1)  Nadolol 20 Mg Tabs (Nadolol) .Marland Kitchen.. 1 once daily 2)  Aspir-low 81 Mg Tbec (Aspirin) .Marland Kitchen.. 1 once daily 3)  Plavix 75 Mg Tabs (Clopidogrel bisulfate) .... Take one by mouth daily 4)  Multivitamins Tabs (Multiple vitamin) .Marland Kitchen.. 1 daily 5)  Avodart 0.5 Mg Caps (Dutasteride) .Marland Kitchen.. 1 tab once daily  Patient Instructions: 1)  Please schedule a follow-up appointment in 6 months .  2)  Referral Appointment Information 3)  Day/Date: 4)  Time: 5)  Place/MD: 6)  Address: 7)  Phone/Fax: 8)  Patient given appointment information. Information/Orders faxed/mailed.  Current Allergies (reviewed today): ! LIPITOR ! ZOCOR

## 2011-01-11 NOTE — Letter (Signed)
Summary: [facility]   Imported By: Lanelle Bal 05/18/2010 10:02:56  _____________________________________________________________________  External Attachment:    Type:   Image     Comment:   External Document  Appended Document: [facility] PSA down to 1.6 Continuing avodart

## 2011-01-11 NOTE — Procedures (Signed)
Summary: 9:15 APPT/AMD      Allergies Added:   Visit Type:  Follow-up Primary Provider:  Cindee Salt MD  CC:  c/o a twinge in left arm at times..  History of Present Illness: Mr. Kahl is seen in followup for complete heart block ischemic heart disease with prior bypass surgery and an implanted ICD for primary prevention. He has had intercurrent normalization of left ventricular systolic function.   when he saw his last in March, he was complaining of a new onset exertional chest and left arm discomfort. He underwent Myoview scanning. Ejection fraction was 53% and no significant ischemia was demonstrable  The patient denies SOB, chest pain, edema or palpitations  His concern today, apart from my being late, is a tingly discomfort in his Left arm, lasting typically just sec and unrealtedto position or activity level;    He had previously undergone in 2007 catheterization demonstrated patent grafts..    His bypass was in 1997.    Current Medications (verified): 1)  Nadolol 20 Mg Tabs (Nadolol) .Marland Kitchen.. 1 Once Daily 2)  Aspir-Low 81 Mg Tbec (Aspirin) .Marland Kitchen.. 1 Once Daily 3)  Plavix 75 Mg Tabs (Clopidogrel Bisulfate) .... Take One By Mouth Daily 4)  Multivitamins  Tabs (Multiple Vitamin) .Marland Kitchen.. 1 Daily 5)  Avodart 0.5 Mg  Caps (Dutasteride) .Marland Kitchen.. 1 Tab Once Daily  Allergies (verified): 1)  ! Lipitor 2)  ! Zocor  Past History:  Past Medical History: Last updated: 10/19/2009 Congestive heart failure Coronary artery disease Diverticulosis, colon GERD Benign prostatic hypertrophy--------------------------------------Dr Cope Heart block--pacer/defib 6/06--------------------------------------Dr Graciela Husbands CHF AAA Hyperlipidemia Colon cancer, hx of Mild cognitive impairment  Past Surgical History: Last updated: 09-27-2009 CABG  1997 RESECTION R COLON  1993 RECURRANT HYPERPLASTIC POLYPS Cholecystectomy CARDILITE  NEGATIVE  1/01 PACEMAKER 02/1996 PROSTATE BX  NEGITIVE   03/2003 ADENSONE STRESS- FIXED AMT DEFECT LV 30% (DILATED)  05/2005 PACER /ICD (TYLER) 05/2005 - ST Jude Atlas Plus ADENOSOINE-MYOVIEW- LOW RISK 11/2006 CATH --NO SIG PROBLEMS  12/2006  Family History: Last updated: 09-27-09 Mom and Dad died of old age Sister died of insulin reaction. 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 Alcohol Use - no  Risk Factors: Smoking Status: quit > 6 months (07/21/2010)  Vital Signs:  Patient profile:   77 year old male Height:      71 inches Weight:      148 pounds BMI:     20.72 Pulse rate:   76 / minute BP sitting:   126 / 73  (left arm) Cuff size:   regular  Vitals Entered By: Bishop Dublin, CMA (November 16, 2010 9:15 AM)    ICD Specifications Following MD:  Sherryl Manges, MD     ICD Vendor:  St Jude     ICD Model Number:  [phone]     ICD Serial Number:  [id number] ICD DOI:  06/01/2005     ICD Implanting MD:  Sherryl Manges, MD  Lead 1:    Location: RA     DOI: 02/20/1996     Model #: 0981     Serial #: [id number]     Status: active Lead 2:    Location: RV     DOI: 06/01/2005     Model #: 6213     Serial #: [id number]     Status: active  Indications::  ICM  Explantation Comments: Pacemaker dependent  ICD Follow Up  Remote Check?  No Battery Voltage:  2.55 V     Charge Time:  11.9 seconds     Underlying rhythm:  dependent ICD Dependent:  Yes       ICD Device Measurements Atrium:  Amplitude: 0.9 mV, Impedance: 275 ohms, Threshold: 1.0 V at 0.6 msec Right Ventricle:  Impedance: 570 ohms, Threshold: 0.5 V at 0.5 msec  Episodes MS Episodes:  0     Percent Mode Switch:  0     Coumadin:  No Shock:  0     ATP:  0     Nonsustained:  0     Atrial Pacing:  41%     Ventricular Pacing:  100%  Brady Parameters Mode DDDR     Lower Rate Limit:  60     Upper Rate Limit 120 PAV 170     Sensed AV Delay:  150  Tachy Zones VF:  200      VT:  164     Next Cardiology Appt Due:  01/12/2011 Tech Comments:  No parameter changes. Atlas battery @ 2.55 since 12/10.  Device function normal.  ROV 2 months with Dr. Graciela Husbands in [place]. Altha Harm, LPN  November 16, 2010 9:35 AM   Impression & Recommendations:  Problem # 1:  IMPLANTABLE  DEFIBRILLATOR  ST J (ICD-V45.02) Device parameters and data were reviewed and no changes were made --still at 2.54 voltage  check in tow months  Problem # 2:  CARDIOMYOPATHY, ISCHEMIC S/P CABG NL EF (ICD-414.8) atypical arm pain,  Idont think this is cardiac His updated medication list for this problem includes:    Nadolol 20 Mg Tabs (Nadolol) .Marland Kitchen... 1 once daily    Aspir-low 81 Mg Tbec (Aspirin) .Marland Kitchen... 1 once daily    Plavix 75 Mg Tabs (Clopidogrel bisulfate) .Marland Kitchen... Take one by mouth daily  Patient Instructions: 1)  Your physician recommends that you schedule a follow-up appointment in: 2 months with Gunnar Fusi 2)  Your physician recommends that you continue on your current medications as directed. Please refer to the Current Medication list given to you today.

## 2011-01-11 NOTE — Assessment & Plan Note (Signed)
Summary: 3 MONTH FOLLO WUP/RBH   Vital Signs:  Patient profile:   77 year old male Weight:      151 pounds Temp:     97.7 degrees F oral Pulse rate:   72 / minute Pulse rhythm:   regular BP sitting:   116 / 60  (left arm) Cuff size:   large  Vitals Entered By: Mervin Hack CMA Duncan Dull) (January 22, 2010 10:46 AM) CC: 68month follow-up   History of Present Illness: Finally cleared all the sinus and respiratory symptoms  He deosn't noticed any change wife notices that mornings are better--gets tired in afternoons, gets mentally sluggish continues to handle finances, helps minimally in house (vacuuming), golfs, etc with activity, he stays sharper (even as the day goes on)  No heart trouble No chest pain Occ has slight "dink"--?missed beat No change in exercise tolerance--walks at mall daily  Voiding okay Nocturia x 2  No arthritis pain   Allergies: 1)  ! Lipitor 2)  ! Zocor  Past History:  Past medical, surgical, family and social histories (including risk factors) reviewed for relevance to current acute and chronic problems.  Past Medical History: Reviewed history from 10/19/2009 and no changes required. Congestive heart failure Coronary artery disease Diverticulosis, colon GERD Benign prostatic hypertrophy--------------------------------------Dr Cope Heart block--pacer/defib 6/06--------------------------------------Dr Graciela Husbands CHF AAA Hyperlipidemia Colon cancer, hx of Mild cognitive impairment  Past Surgical History: Reviewed history from 09/03/2009 and no changes required. CABG  1997 RESECTION R COLON  1993 RECURRANT HYPERPLASTIC POLYPS Cholecystectomy CARDILITE  NEGATIVE  1/01 PACEMAKER 02/1996 PROSTATE BX  NEGITIVE  03/2003 ADENSONE STRESS- FIXED AMT DEFECT LV 30% (DILATED)  05/2005 PACER /ICD (TYLER) 05/2005 - ST Jude Atlas Plus ADENOSOINE-MYOVIEW- LOW RISK 11/2006 CATH --NO SIG PROBLEMS  12/2006  Family History: Reviewed history from  09/03/2009 and no changes required. Mom and Dad died of old age Sister died of insulin reaction. 2 brothers--also deceased    Social History: Reviewed history from 09/03/2009 and no changes required. 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   Neck:  supple, no masses, no thyromegaly, no carotid bruits, and no cervical lymphadenopathy.   Lungs:  normal respiratory effort and normal breath sounds.   Heart:  normal rate, regular rhythm, no murmur, and no gallop.   Extremities:  no edema Psych:  normally interactive, good eye contact, not anxious appearing, and not depressed appearing.     Impression & Recommendations:  Problem # 1:  MILD COGNITIVE IMPAIRMENT SO STATED (ICD-331.83) Assessment Unchanged stable status No apraxia or loss of executive function did have improvement off statin, though may have progressed since then wonders about plavix had 3 concussions in past--could be related to this  No med trial unless worsens  Problem # 2:  CARDIOMYOPATHY, ISCHEMIC S/P CABG NL EF (ICD-414.8) Assessment: Comment Only doing well from cardiac standpoint  His updated medication list for this problem includes:    Nadolol 20 Mg Tabs (Nadolol) .Marland Kitchen... 1 once daily    Aspir-low 81 Mg Tbec (Aspirin) .Marland Kitchen... 1 once daily    Plavix 75 Mg Tabs (Clopidogrel bisulfate) .Marland Kitchen... 1 daily  Problem # 3:  HYPERLIPIDEMIA (ICD-272.4) Assessment: Comment Only will stay off statin since it did seem to affect his cognition  Problem # 4:  BENIGN PROSTATIC HYPERTROPHY (ICD-600.00) Assessment: Unchanged voiding okay on avodart  Complete Medication List: 1)  Nadolol 20 Mg Tabs (Nadolol) .Marland Kitchen.. 1 once daily 2)  Aspir-low 81 Mg Tbec (Aspirin) .Marland Kitchen.. 1 once daily 3)  Plavix 75 Mg Tabs  (Clopidogrel bisulfate) .Marland Kitchen.. 1 daily 4)  Multivitamins Tabs (Multiple vitamin) .Marland Kitchen.. 1 daily 5)  Avodart 0.5 Mg Caps (Dutasteride) .... As directed  Patient Instructions: 1)  Please schedule a follow-up appointment in 6 months .   Current Allergies (reviewed today): ! LIPITOR ! ZOCOR

## 2011-01-11 NOTE — Assessment & Plan Note (Signed)
Summary: 3 month Defib check      Allergies Added:   Visit Type:  Follow-up Primary Provider:  Cindee Salt MD  CC:  Denies ches pain or shortness of breath..  History of Present Illness: [name] Rich is seen in followup for complete heart block ischemic heart disease with prior bypass surgery and an implanted ICD for primary prevention. He has had intercurrent normalization of left ventricular systolic function.   when he saw his last in March, he was complaining of a new onset exertional chest and left arm discomfort. He underwent Myoview scanning. Ejection fraction was 53% and no significant ischemia was demonstrable  The patient denies SOB, chest pain, edema or palpitations    He had previously undergone in 2007 catheterization demonstrated patent grafts..    His bypass was in 1997.    Current Medications (verified): 1)  Nadolol 20 Mg Tabs (Nadolol) .Marland Kitchen.. 1 Once Daily 2)  Aspir-Low 81 Mg Tbec (Aspirin) .Marland Kitchen.. 1 Once Daily 3)  Plavix 75 Mg Tabs (Clopidogrel Bisulfate) .... Take One By Mouth Daily 4)  Multivitamins  Tabs (Multiple Vitamin) .Marland Kitchen.. 1 Daily 5)  Avodart 0.5 Mg  Caps (Dutasteride) .Marland Kitchen.. 1 Tab Once Daily  Allergies (verified): 1)  ! Lipitor 2)  ! Zocor  Past History:  Past Medical History: Last updated: 10/19/2009 Congestive heart failure Coronary artery disease Diverticulosis, colon GERD Benign prostatic hypertrophy--------------------------------------Dr Cope Heart block--pacer/defib 6/06--------------------------------------Dr Graciela Husbands CHF AAA Hyperlipidemia Colon cancer, hx of Mild cognitive impairment  Past Surgical History: Last updated: 2009-09-19 CABG  1997 RESECTION R COLON  1993 RECURRANT HYPERPLASTIC POLYPS Cholecystectomy CARDILITE  NEGATIVE  1/01 PACEMAKER 02/1996 PROSTATE BX  NEGITIVE  03/2003 ADENSONE STRESS- FIXED AMT DEFECT LV 30% (DILATED)  05/2005 PACER /ICD (TYLER) 05/2005 - ST Jude Atlas Plus ADENOSOINE-MYOVIEW- LOW RISK  11/2006 CATH --NO SIG PROBLEMS  12/2006  Family History: Last updated: 09/19/2009 Mom and Dad died of old age Sister died of insulin reaction. 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 HEENT Normal.  Neck veins were flat, carotids were brisk.  Lungs were clear.  Heart sounds were regular without murmurs or gallops.  Abdomen was soft with active bowel sounds. There is no clubbing cyanosis or edema. Skin Warm and dry neuro exam notable for  memory issues    ICD Specifications Following MD:  Sherryl Manges, MD     ICD Vendor:  [facility] Jude     ICD Model Number:  [id number]     ICD Serial Number:  [id number] ICD DOI:  06/01/2005     ICD Implanting MD:  Sherryl Manges, MD  Lead 1:    Location: RA     DOI: 02/20/1996     Model #: 7169     Serial #: [id number]     Status: active Lead 2:    Location: RV  DOI: 06/01/2005     Model #: 7425     Serial #: [id number]     Status: active  Indications::  ICM  Explantation Comments: Pacemaker dependent  ICD Follow Up Remote Check?  No Battery Voltage:  2.55 V     Charge Time:  11.9 seconds     Underlying rhythm:  dependent ICD Dependent:  Yes       ICD Device Measurements Atrium:  Amplitude: 0.9 mV, Impedance: 260 ohms, Threshold: 1.25 V at 0.6 msec Right Ventricle:  Impedance: 560 ohms, Threshold: 0.75 V at 0.5 msec  Episodes MS Episodes:  194     Percent Mode Switch:  <1%     Coumadin:  No Shock:  0      ATP:  0     Nonsustained:  0     Atrial Pacing:  43%     Ventricular Pacing:  100%  Brady Parameters Mode DDDR     Lower Rate Limit:  60     Upper Rate Limit 120 PAV 170     Sensed AV Delay:  150  Tachy Zones VF:  200     VT:  164     Next Cardiology Appt Due:  12/12/2010 Tech Comments:  No parameter changes.  Battery voltage 2.55 since 12/10 on this Atlas device.  ROV 3 months with Dr. Graciela Husbands in [place]. Altha Harm, LPN  September 23, 2010 10:32 AM   Impression & Recommendations:  Problem # 1:  IMPLANTABLE  DEFIBRILLATOR  ST J (ICD-V45.02) Device parameters and data were reviewed and no changes were made  approaching ERI  Problem # 2:  CARDIOMYOPATHY, ISCHEMIC S/P CABG NL EF (ICD-414.8) stable without symptoms with normal left ventricular function His updated medication list for this problem includes:    Nadolol 20 Mg Tabs (Nadolol) .Marland Kitchen... 1 once daily    Aspir-low 81 Mg Tbec (Aspirin) .Marland Kitchen... 1 once daily    Plavix 75 Mg Tabs (Clopidogrel bisulfate) .Marland Kitchen... Take one by mouth daily  Problem # 3:  AV BLOCK, COMPLETE (ICD-426.0) device dependent we will see him in 2 months His updated medication list for this problem includes:    Nadolol 20 Mg Tabs (Nadolol) .Marland Kitchen... 1 once daily    Aspir-low 81 Mg Tbec (Aspirin) .Marland Kitchen... 1 once daily    Plavix 75 Mg Tabs (Clopidogrel bisulfate) .Marland Kitchen... Take one by mouth daily  Patient Instructions: 1)  Your physician recommends that you schedule a follow-up appointment in: 2 months

## 2011-01-11 NOTE — Procedures (Signed)
Summary: F3M/AMD  Medications Added AVODART 0.5 MG  CAPS (DUTASTERIDE) 1 tab once daily        Visit Type:  Pacemaker check Primary Provider:  Cindee Salt MD  CC:  no cardiac complaints today.  History of Present Illness: Mr. Simmering is seen in followup for complete heart block ischemic heart disease with prior bypass surgery and an implanted ICD for primary prevention. He has had intercurrent normalization of left ventricular systolic function.   when he saw his last in March, he was complaining of a new onset exertional chest and left arm discomfort. He underwent Myoview scanning. Ejection fraction was 53% and no significant ischemia was demonstrable   He had previously undergone in 2007 catheterization demonstrated patent grafts..    His bypass was in 1997.    Current Medications (verified): 1)  Nadolol 20 Mg Tabs (Nadolol) .Marland Kitchen.. 1 Once Daily 2)  Aspir-Low 81 Mg Tbec (Aspirin) .Marland Kitchen.. 1 Once Daily 3)  Plavix 75 Mg Tabs (Clopidogrel Bisulfate) .Marland Kitchen.. 1 Daily 4)  Multivitamins  Tabs (Multiple Vitamin) .Marland Kitchen.. 1 Daily 5)  Avodart 0.5 Mg  Caps (Dutasteride) .Marland Kitchen.. 1 Tab Once Daily  Allergies: 1)  ! Lipitor 2)  ! Zocor  Past History:  Past Medical History: Last updated: 10/19/2009 Congestive heart failure Coronary artery disease Diverticulosis, colon GERD Benign prostatic hypertrophy--------------------------------------Dr Cope Heart block--pacer/defib 6/06--------------------------------------Dr Graciela Husbands CHF AAA Hyperlipidemia Colon cancer, hx of Mild cognitive impairment  Past Surgical History: Last updated: 09/03/2009 CABG  1997 RESECTION R COLON  1993 RECURRANT HYPERPLASTIC POLYPS Cholecystectomy CARDILITE  NEGATIVE  1/01 PACEMAKER 02/1996 PROSTATE BX  NEGITIVE  03/2003 ADENSONE STRESS- FIXED AMT DEFECT LV 30% (DILATED)  05/2005 PACER /ICD (TYLER) 05/2005 - ST Jude Atlas Plus ADENOSOINE-MYOVIEW- LOW RISK 11/2006 CATH --NO SIG PROBLEMS  12/2006  Vital  Signs:  Patient profile:   77 year old male Height:      71 inches Weight:      149 pounds BMI:     20.86 Pulse rate:   61 / minute Pulse rhythm:   regular BP sitting:   131 / 72  (left arm) Cuff size:   large  Vitals Entered By: Danielle Rankin, CMA (June 28, 2010 1:43 PM)  Physical Exam  General:  The patient was alert and oriented in no acute distress. HEENT Normal.  Neck veins were flat, carotids were brisk.  Lungs were clear.  Heart sounds were regular without murmurs or gallops.  Abdomen was soft with active bowel sounds. There is no clubbing cyanosis or edema. Skin Warm and dry     ICD Specifications Following MD:  Sherryl Manges, MD     ICD Vendor:  St Jude     ICD Model Number:  [phone]     ICD Serial Number:  [id number] ICD DOI:  06/01/2005     ICD Implanting MD:  Sherryl Manges, MD  Lead 1:    Location: RA     DOI: 02/20/1996     Model #: 1478     Serial #: [id number]     Status: active Lead 2:    Location: RV     DOI: 06/01/2005     Model #: 6578     Serial #: [id number]     Status: active  Indications::  ICM  Explantation Comments: Pacemaker dependent  ICD Follow Up Remote Check?  No Battery Voltage:  2.57 V     Charge Time:  11.9 seconds     Underlying rhythm:  dependent ICD Dependent:  Yes       ICD Device Measurements Atrium:  Amplitude: 1.3 mV, Impedance: 285 ohms, Threshold: 1.0 V at 0.6 msec Right Ventricle:  Impedance: 530 ohms, Threshold: 0.5 V at 0.5 msec  Episodes MS Episodes:  143     Percent Mode Switch:  <1%     Coumadin:  No Shock:  0     ATP:  0     Nonsustained:  0     Atrial Pacing:  41%     Ventricular Pacing:  100%  Brady Parameters Mode DDDR     Lower Rate Limit:  60     Upper Rate Limit 120 PAV 170     Sensed AV Delay:  150  Tachy Zones VF:  200     VT:  164     Next Cardiology Appt Due:  09/11/2010 Tech Comments:  No parameter changes.  Device function normal.  Atlas device battery reached 2.55 on 12/10 now 2.57 today.  ROV 3 months [facility]. Altha Harm, LPN  June 28, 2010 1:59 PM   Impression & Recommendations:  Problem # 1:  IMPLANTABLE  DEFIBRILLATOR  ST J (ICD-V45.02) Device parameters and data were reviewed and no changes were made  Problem # 2:  CARDIOMYOPATHY, ISCHEMIC S/P CABG NL EF (ICD-414.8) He continues to have episodes of almost certainly noncardiac chest pain.  Continue him on his current meds His updated medication list for this problem includes:    Nadolol 20 Mg Tabs (Nadolol) .Marland Kitchen... 1 once daily    Aspir-low 81 Mg Tbec (Aspirin) .Marland Kitchen... 1 once daily    Plavix 75 Mg Tabs (Clopidogrel bisulfate) .Marland Kitchen... 1 daily

## 2011-01-11 NOTE — Progress Notes (Signed)
Summary: Nuclear Pre-Procedure  Phone Note Outgoing Call   Call placed by: Milana Na, EMT-P,  March 04, 2010 3:53 PM Summary of Call: Reviewed information on Myoview Information Sheet (see scanned document for further details).  Both the day and night phone numbers were a fast busy. No patient contact by phone.     Nuclear Med Background Indications for Stress Test: Evaluation for Ischemia, Graft Patency, Stent Patency   History: Abnormal EKG, CABG, Defibrillator, Heart Catheterization, Myocardial Perfusion Study, Pacemaker, Stents  History Comments: '97 CABGx3 '06 Defibrillator (ICM) '06 STENTS SVG-OM2 '07 Myoview Perfusion Study: AntApical Scar with mild P-I Ischemia EF 57% '08 Heart Catherization: Patent Grafts/Stents EF 45-50%   Symptoms Comments: 02/10 L. Arm Pain while shoveling snow.   Nuclear Pre-Procedure Cardiac Risk Factors: Family History - CAD, History of Smoking, LBBB Height (in): 71  Nuclear Med Study Referring MD:  S.Klein

## 2011-01-11 NOTE — Progress Notes (Signed)
Summary: SAMPLES   Phone Note Call from Patient Call back at Home Phone [phone]   Caller: SELF Call For: [name] Rich Summary of Call: WOULD LIKE SOME SAMPLES OF PLAVIX-HAS NOT RECEIVED HIS RX YET Initial call taken by: Harlon Flor,  December 21, 2009 10:26 AM  Follow-up for Phone Call        samples left at desk. Charlena Cross RN BSN

## 2011-01-11 NOTE — Progress Notes (Signed)
Summary: wants appt  ---- Converted from flag ---- ---- 11/19/2010 8:27 AM, Cindee Salt MD wrote: put him in at 12:30 ----I guess along with his wife No more at all today---I have appts at [facility]  ---- 11/19/2010 8:15 AM, Lowella Petties CMA, AAMA wrote: Pt was ER last night with rectal bleeding and was told to see his PCP this morning.  Wife is adamant that he see you.  Please advise on whether you can work him in. ------------------------------

## 2011-01-11 NOTE — Miscellaneous (Signed)
Summary: Orders Update  Clinical Lists Changes  Orders: Added new Test order of Abdominal Aorta Duplex (Abd Aorta Duplex) - Signed 

## 2011-01-11 NOTE — Progress Notes (Signed)
Summary: samples   Phone Note Call from Patient Call back at Home Phone [phone]   Caller: self Call For: Rawleigh Rode Summary of Call: pt needs samples of plavix-10 days worth Initial call taken by: Harlon Flor,  January 06, 2010 1:14 PM  Follow-up for Phone Call        called [employer] to see why mr Eisenhardt has not recieved his plavix yet. they informed us that he no longer has benefits with this company.Efraim Kaufmann Follow-up by: Harlon Flor,  January 06, 2010 1:38 PM    Additional Follow-up for Phone Call Additional follow up Details #2::    Patient was very upset about not receiving his medication and came to the office to get samples.  While here in the office I called [employer] with Mr. Magner, they told him that his benefits were ineligible and to call benefits.  Mr. Pfiester did not have the number so I told him I would do some research on the number and who to contact and give him a call at home later today.  After he left I called [facility] [address] where they gave me the benefits number [phone] and [phone] his prescription card along with his ID number [id number].  I called [facility] and gave them the ID number where they stated that Mr. Fabiano had been switched to the [employer] plan and his prescription for Plavix was available and they needed to verify his address.  While speaking with Gregary Signs at [facility] he told me this prescription would be mailed to Mr. Amory in 5-7 business days and gave me the confirmation # [id number].  He then transferred me to the pharmacy to request an additional refill on the prescription which was also done at this time.  I called Mr. Gut back to notify him of the changes to his policy and to let him know the medication should be arriving at his house in 5-7 business days. Follow-up by: [place],  January 06, 2010 3:48 PM

## 2011-01-11 NOTE — Progress Notes (Signed)
Summary: plavix samples   Phone Note Call from Patient Call back at Home Phone [phone]   Caller: Patient Reason for Call: Talk to Nurse Summary of Call: request plavix samples, very upset states that this has been going on for 90days, has also called [place] [facility], wants call back today or he will be here in the morning Initial call taken by: Migdalia Dk,  January 06, 2010 1:22 PM  Follow-up for Phone Call        [place], RN and she has been s/w this gentleman all day. He is on the way to the [place] office to get some samples. Melissa called his mail order pharmacy and they told her that the pt is NO longer covered by them, so they would not be refilling it for him. I called the pt and he was not home. I s/w his wife and let her know that this was the [place] office that he had called and she said it was a mistake. I also made her aware that he was no longer covered by that mail order co. and they may want to check on that. I also told her that we only get a limit stock of Plavix and that we couldn't cont to give him samples each month (per Melissa the pt does this every month). She was very pleasant and said that she understands and she will pass the message to him. Follow-up by: Duncan Dull, RN, BSN,  January 06, 2010 1:46 PM

## 2011-01-12 ENCOUNTER — Other Ambulatory Visit (AMBULATORY_SURGERY_CENTER): Payer: Medicare Other | Admitting: Gastroenterology

## 2011-01-12 ENCOUNTER — Encounter: Payer: Self-pay | Admitting: Gastroenterology

## 2011-01-12 ENCOUNTER — Ambulatory Visit: Admit: 2011-01-12 | Payer: Self-pay | Admitting: Gastroenterology

## 2011-01-12 DIAGNOSIS — K648 Other hemorrhoids: Secondary | ICD-10-CM

## 2011-01-12 DIAGNOSIS — K625 Hemorrhage of anus and rectum: Secondary | ICD-10-CM

## 2011-01-12 DIAGNOSIS — K5730 Diverticulosis of large intestine without perforation or abscess without bleeding: Secondary | ICD-10-CM

## 2011-01-12 DIAGNOSIS — Z85048 Personal history of other malignant neoplasm of rectum, rectosigmoid junction, and anus: Secondary | ICD-10-CM

## 2011-01-13 ENCOUNTER — Telehealth (INDEPENDENT_AMBULATORY_CARE_PROVIDER_SITE_OTHER): Payer: Self-pay | Admitting: *Deleted

## 2011-01-13 NOTE — Letter (Signed)
Summary: [facility] Instructions  [facility]  [address] [place], [place] [address]   Phone: [phone]  Fax: [phone]       [name] Rich    1932-07-14    MRN: [id number]        Procedure Day Dorna Bloom:  Wednesday 01/12/2011     Arrival Time:  7:30 am     Procedure Time: 8:30 am     Location of Procedure:                    _x _  [facility] (4th Floor)                        PREPARATION FOR COLONOSCOPY WITH MOVIPREP   Starting 5 days prior to your procedure Friday 1/27 do not eat nuts, seeds, popcorn, corn, beans, peas,  salads, or any raw vegetables.  Do not take any fiber supplements (e.g. Metamucil, Citrucel, and Benefiber).  THE DAY BEFORE YOUR PROCEDURE         DATE: Tuesday 1/31  1.  Drink clear liquids the entire day-NO SOLID FOOD  2.  Do not drink anything colored red or purple.  Avoid juices with pulp.  No orange juice.  3.  Drink at least 64 oz. (8 glasses) of fluid/clear liquids during the day to prevent dehydration and help the prep work efficiently.  CLEAR LIQUIDS INCLUDE: Water Jello Ice Popsicles Tea (sugar ok, no milk/cream) Powdered fruit flavored drinks Coffee (sugar ok, no milk/cream) Gatorade Juice: apple, white grape, white cranberry  Lemonade Clear bullion, consomm, broth Carbonated beverages (any kind) Strained chicken noodle soup Hard Candy                             4.  In the morning, mix first dose of MoviPrep solution:    Empty 1 Pouch A and 1 Pouch B into the disposable container    Add lukewarm drinking water to the top line of the container. Mix to dissolve    Refrigerate (mixed solution should be used within 24 hrs)  5.  Begin drinking the prep at 5:00 p.m. The MoviPrep container is divided by 4 marks.   Every 15 minutes drink the solution down to the next mark (approximately 8 oz) until the full liter is complete.   6.  Follow completed prep with 16 oz of clear liquid of your choice (Nothing  red or purple).  Continue to drink clear liquids until bedtime.  7.  Before going to bed, mix second dose of MoviPrep solution:    Empty 1 Pouch A and 1 Pouch B into the disposable container    Add lukewarm drinking water to the top line of the container. Mix to dissolve    Refrigerate  THE DAY OF YOUR PROCEDURE      DATE: Wednesday 2/1  Beginning at 3:30 a.m. (5 hours before procedure):         1. Every 15 minutes, drink the solution down to the next mark (approx 8 oz) until the full liter is complete.  2. Follow completed prep with 16 oz. of clear liquid of your choice.    3. You may drink clear liquids until 6:30 am (2 HOURS BEFORE PROCEDURE).   MEDICATION INSTRUCTIONS      Additional medication instructions: Stay on Aspirin and Plavix         OTHER INSTRUCTIONS  You  will need a responsible adult at least 75 years of age to accompany you and drive you home.   This person must remain in the waiting room during your procedure.  Wear loose fitting clothing that is easily removed.  Leave jewelry and other valuables at home.  However, you may wish to bring a book to read or  an iPod/MP3 player to listen to music as you wait for your procedure to start.  Remove all body piercing jewelry and leave at home.  Total time from sign-in until discharge is approximately 2-3 hours.  You should go home directly after your procedure and rest.  You can resume normal activities the  day after your procedure.  The day of your procedure you should not:   Drive   Make legal decisions   Operate machinery   Drink alcohol   Return to work  You will receive specific instructions about eating, activities and medications before you leave.    The above instructions have been reviewed and explained to me by   Doristine Church RN II  January 05, 2011 8:51 AM _    I fully understand and can verbalize these instructions _____________________________ Date _________

## 2011-01-13 NOTE — Progress Notes (Signed)
Summary: Work in this wk   Phone Note From Other Clinic   Caller: Shirlee Limerick @ Dr Alphonsus Sias [phone] Call For: Dr Jarold Motto Reason for Call: Schedule Patient Appt Summary of Call: Would like pt worked in this week for significant rectal bleed. Initial call taken by: Leanor Kail [facility],  November 22, 2010 11:33 AM  Follow-up for Phone Call        Hx with Jarold Motto in 07.  He was seen in the ER 11/18/10  with a large GI bleed no current bleeding, but Dr Alphonsus Sias would like patient seen.  ? which ER no records in e-chart.  Patient  is scheduled to see Mike Gip PA tomorrow at 2:00.  I have left a message for Shirlee Limerick about the appointment date and time.   Follow-up by: Darcey Nora RN, CGRN,  November 22, 2010 2:03 PM

## 2011-01-13 NOTE — Assessment & Plan Note (Signed)
Summary: FOLLOW-UP/JRR   Vital Signs:  Patient profile:   77 year old male Weight:      146 pounds Pulse rate:   72 / minute Pulse rhythm:   regular BP sitting:   130 / 60  (left arm) Cuff size:   large  Vitals Entered By: Mervin Hack CMA Duncan Dull) (November 22, 2010 10:39 AM) CC: follow-up visit   History of Present Illness: Uneventful weekend No further bleeding Appetite is fine Bowels moving normally--no constipation  Still off the aspirin and plavix  No neuro symptoms  Allergies: 1)  ! Lipitor 2)  ! Zocor  Past History:  Past medical, surgical, family and social histories (including risk factors) reviewed for relevance to current acute and chronic problems.  Past Medical History: Reviewed history from 10/19/2009 and no changes required. Congestive heart failure Coronary artery disease Diverticulosis, colon GERD Benign prostatic hypertrophy--------------------------------------Dr Cope Heart block--pacer/defib 6/06--------------------------------------Dr Graciela Husbands CHF AAA Hyperlipidemia Colon cancer, hx of Mild cognitive impairment  Past Surgical History: Reviewed history from 09/03/2009 and no changes required. CABG  1997 RESECTION R COLON  1993 RECURRANT HYPERPLASTIC POLYPS Cholecystectomy CARDILITE  NEGATIVE  1/01 PACEMAKER 02/1996 PROSTATE BX  NEGITIVE  03/2003 ADENSONE STRESS- FIXED AMT DEFECT LV 30% (DILATED)  05/2005 PACER /ICD (TYLER) 05/2005 - ST Jude Atlas Plus ADENOSOINE-MYOVIEW- LOW RISK 11/2006 CATH --NO SIG PROBLEMS  12/2006  Family History: Reviewed history from 09/03/2009 and no changes required. Mom and Dad died of old age Sister died of insulin reaction. 2 brothers--also deceased    Social History: Reviewed history from 09/03/2009 and no changes required. 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   Abdomen:  soft, non-tender, and no masses.   Neurologic:  ongoing memory issues--wife needs to prompt him at times   Impression & Recommendations:  Problem # 1:  RECTAL BLEEDING (ICD-569.3) Assessment Improved  no further bleeding will restart the aspirin and plavix GI eval to see where the bleeding came from (AVM, diverticulosis, mass?)  Orders: Venipuncture (04540) TLB-CBC Platelet - w/Differential (85025-CBCD) Gastroenterology Referral (GI)  Complete Medication List: 1)  Nadolol 20 Mg Tabs (Nadolol) .Marland Kitchen.. 1 once daily 2)  Aspir-low 81 Mg Tbec (Aspirin) .Marland Kitchen.. 1 once daily 3)  Plavix 75 Mg Tabs (Clopidogrel bisulfate) .... Take one by mouth daily 4)  Multivitamins Tabs (Multiple vitamin) .Marland Kitchen.. 1 daily 5)  Avodart 0.5 Mg Caps (Dutasteride) .Marland Kitchen.. 1 tab once daily  Patient Instructions: 1)  Please keep appt here on Feb 20th 2)  Referral Appointment Information 3)  Day/Date: 4)  Time: 5)  Place/MD: 6)  Address: 7)  Phone/Fax: 8)  Patient given appointment information. Information/Orders faxed/mailed.   Orders Added: 1)  Venipuncture [98119] 2)  TLB-CBC Platelet - w/Differential [85025-CBCD] 3)  Est. Patient Level III [14782] 4)  Gastroenterology Referral [GI]    Current Allergies (reviewed today): ! LIPITOR ! ZOCOR

## 2011-01-13 NOTE — Miscellaneous (Signed)
Summary: prep  Clinical Lists Changes  Medications: Added new medication of MOVIPREP 100 GM  SOLR (PEG-KCL-NACL-NASULF-NA ASC-C) As per prep instructions. - Signed Rx of MOVIPREP 100 GM  SOLR (PEG-KCL-NACL-NASULF-NA ASC-C) As per prep instructions.;  #1 x 0;  Signed;  Entered by: Doristine Church RN II;  Authorized by: Mardella Layman MD [facility];  Method used: Electron[facility] to [facility]. [phone]*, [address] [place], [place], [place]  [address], Ph: [phone] or [phone], Fax: [phone] Observations: Added new observation of ALLERGY REV: Done (01/05/2011 15:33)    Prescriptions: MOVIPREP 100 GM  SOLR (PEG-KCL-NACL-NASULF-NA ASC-C) As per prep instructions.  #1 x 0   Entered by:   Doristine Church RN II   Authorized by:   Mardella Layman MD [facility]   Signed by:   Doristine Church RN II on 01/05/2011   Method used:   [facility]ronically to        [facility] [phone]* (retail)       [address] [place], [place]  [address]       Ph: [phone] or [phone]       Fax: [phone]   RxID:   [id number]

## 2011-01-13 NOTE — Assessment & Plan Note (Signed)
Summary: post ER visit for rectal bleeding/sheri    History of Present Illness Visit Type: Initial Consult Primary GI MD: Sheryn Bison MD FACP FAGA Primary Hoke Baer: Cindee Salt MD Requesting Orvel Cutsforth: Cindee Salt MD Chief Complaint: [facility] from last Thursday night, pt denies any problems today History of Present Illness:   77 YO MALW KNOWN TO DR. PATTERSON. HE HAS HX OF COLON CANCER IN 1993. HE UNDERWENT RIGHT HEMICOLECTOMY. HE HAS HAD SERIAL COLONOSCOPIES SINCE, THE LAST IN 01/2006. THIS SHOWED DIVERTICULOSIS,INT HEMORRHOID, AND ONE SIGMOID POLYP/TUBULAR ADENOMA. HE HAS MULTIPLE HEALTH ISSUES WITH ISCHEMIC CARDIOMYOPATHY,.IS S/P CABG IN 97, IS S/P PACER/DEFIBRILLATOR PLACEMENT IN 2006. HE IS ON ASA, AND PLAVIX.  HE HAD AN EPISODE ON 12/7 WITH PASSAGE OF A LARGE AMOUNT OF RED BLOOD WITH A BM. HE WAS SEEN IN THE ER AT [facility], THEN BY DR. Alphonsus Sias ON 12/12. HE HAS NOT HAD ANY FURTHER BLEEDING SINCE. HE DENIES ANY ABDOMINAL PAIN,CRAMPING, NO MELENA. NO NAUSEA,WEAKNESS ETC.  HGB WAS 14.6 ON 12/11. BLOOD CLOTS WERE NOTED ON RECTAL IN THE ER. ON 12/7.  ASA/ AND PLAVIX HAD BEEN HELD BY DR. Alphonsus Sias AND WERE RESTARTED YESTERDAY.   GI Review of Systems      Denies abdominal pain, acid reflux, belching, bloating, chest pain, dysphagia with liquids, dysphagia with solids, heartburn, loss of appetite, nausea, vomiting, vomiting blood, weight loss, and  weight gain.      Reports rectal bleeding.     Denies anal fissure, black tarry stools, change in bowel habit, constipation, diarrhea, diverticulosis, fecal incontinence, heme positive stool, hemorrhoids, irritable bowel syndrome, jaundice, light color stool, liver problems, and  rectal pain.    Current Medications (verified): 1)  Nadolol 20 Mg Tabs (Nadolol) .Marland Kitchen.. 1 Once Daily 2)  Aspir-Low 81 Mg Tbec (Aspirin) .Marland Kitchen.. 1 Once Daily 3)  Plavix 75 Mg Tabs (Clopidogrel Bisulfate) .... Take One By Mouth Daily 4)  Multivitamins  Tabs  (Multiple Vitamin) .Marland Kitchen.. 1 Daily 5)  Avodart 0.5 Mg  Caps (Dutasteride) .Marland Kitchen.. 1 Tab Once Daily 6)  Centrum Silver  Tabs (Multiple Vitamins-Minerals) .Marland Kitchen.. 1 By Mouth Once Daily  Allergies (verified): 1)  ! Lipitor 2)  ! Zocor  Past History:  Past Medical History: Congestive heart failure Coronary artery disease-S/P CABG ISCHEMIC CARDIOMYOPATHY Diverticulosis, colon GERD Benign prostatic hypertrophy--------------------------------------Dr Cope Heart block--pacer/defib 6/06--------------------------------------Dr Graciela Husbands  AAA Hyperlipidemia Colon cancer, hx of 1993 Mild cognitive impairment  Past Surgical History: CABG  1997 RESECTION R COLON  1993-CECAL CANCER RECURRANT HYPERPLASTIC POLYPS Cholecystectomy CARDILITE  NEGATIVE  1/01 PACEMAKER 02/1996 PROSTATE BX  NEGITIVE  03/2003 ADENSONE STRESS- FIXED AMT DEFECT LV 30% (DILATED)  05/2005 PACER /ICD (TYLER) 05/2005 - ST Jude Atlas Plus ADENOSOINE-MYOVIEW- LOW RISK 11/2006 CATH --NO SIG PROBLEMS  12/2006  Family History: Reviewed history from 09/03/2009 and no changes required. Mom and Dad died of old age Sister died of insulin reaction. 2 brothers--also deceased    Social History: Reviewed history from 09/03/2009 and no changes required. 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 Alcohol Use - no  Review of Systems  The patient denies allergy/sinus, anemia, anxiety-new, arthritis/joint pain, back pain, blood in urine, breast changes/lumps, change in vision, confusion, cough, coughing up blood, depression-new, fainting, fatigue, fever, headaches-new, hearing problems, heart murmur, heart rhythm changes, itching, menstrual pain, muscle pains/cramps, night sweats, nosebleeds, pregnancy symptoms, shortness of breath, skin rash, sleeping problems, sore throat, swelling of feet/legs, swollen lymph glands, thirst - excessive , urination -  excessive , urination changes/pain, urine  leakage, vision changes, and voice change.         SEE HPI  Vital Signs:  Patient profile:   77 year old male Height:      71 inches Weight:      147 pounds BMI:     20.58 BSA:     1.85 Pulse rate:   70 / minute Pulse rhythm:   regular BP sitting:   90 / 62  (left arm)  Vitals Entered By: Merri Ray CMA Duncan Dull) (November 23, 2010 2:00 PM)  Physical Exam  General:  Well developed, well nourished, no acute distress,ELDERLY. Head:  Normocephalic and atraumatic. Eyes:  PERRLA, no icterus. Lungs:  Clear throughout to auscultation. Heart:  Regular rate and rhythm; , rubs,  or bruits.heart murmur systolic:.  PACER/DEFIBRILLATOR LEFT CHEST Abdomen:  SOFT, NONTENDER, NO MASS OR HSM,BS+ Rectal:  NOT REPEATED. Extremities:  No clubbing, cyanosis, edema or deformities noted. Neurologic:  Alert and  oriented x4;  grossly normal neurologically APPEARS SOMEWHAT CONFUSED,SLOW RESONSE TIME Psych:  Alert and cooperative. Normal mood and affect.depressed affect and poor concentration.     Impression & Recommendations:  Problem # 1:  RECTAL BLEEDING (ICD-569.3) Assessment New 77 YO MALE WITH HX OF SINGLE EPISODE OF BRB PER  RECTUM ON 11/17/10. ETIOLOGY UNCLEAR. R/O SELF-LIMITED DIVERTICULAR ORIGIN, R/O OCCULT COLON LESION, VS. HEMORRHOIDAL  IN ANTICOAGULATED PT.  CONTINUE ASA/PLAVIX SCHEDULE FOR COLONOSCOPY WITH DR.  PATTERSON. PROCEDURE WAS DISCUSSED IN DETAIL  WITH THE PT AND HIS WIFE. ADVISED TO CALL us OR GO TO THE ER. IF HE HAS RECURRENT BLEEDING.  Problem # 2:  CORONARY ARTERY DISEASE (ICD-414.00) Assessment: Comment Only  Problem # 3:  CONGESTIVE HEART FAILURE (ICD-428.00) Assessment: Comment Only ISCHEMIC CARDIOMYOPATHY-S/P  PACER/DEFIBRILLATOR 2006  Problem # 4:  MILD COGNITIVE IMPAIRMENT SO STATED (ICD-331.83) Assessment: Comment Only  Problem # 5:  HYPERTENSION (ICD-401.9) Assessment: Comment Only  Problem # 6:  ABDOMINAL AORTIC ANEURYSM (ICD-441.4) Assessment:  Comment Only  Problem # 7:  ADENOCARCINOMA, COLON, CECUM (ICD-153.4) Assessment: Comment Only SEE ABOVE  Patient Instructions: 1)  We made the Previsit with the nurse  to explain the Colonoscopy on 01-05-2011 at 8:30Am, 3rd floor of [facility], our building. 2)  The Colonoscopy is schedueld on 01-12-2011 at 8:30 AM.  The nurse will explain the procedure. 3)  You will be staying on the Plavix and the aspirin for the procedure. 4)  Copy sent to : Tillman Abide Md 5)  The medication list was reviewed and reconciled.  All changed / newly prescribed medications were explained.  A complete medication list was provided to the patient / caregiver.

## 2011-01-13 NOTE — Miscellaneous (Signed)
Summary: [facility]  Clinical Lists Changes  Medications: Added new medication of MOVIPREP 100 GM  SOLR (PEG-KCL-NACL-NASULF-NA ASC-C) Allergies: Added new allergy or adverse reaction of * TAPE Observations: Added new observation of ALLERGY REV: Done (01/05/2011 8:12)

## 2011-01-19 NOTE — Procedures (Signed)
Summary: Colonoscopy  Patient: [name] Note: All result statuses are Final unless otherwise noted.  Tests: (1) Colonoscopy (COL)   COL Colonoscopy           DONE     [facility]     [address]     [place], [place]  [address]           COLONOSCOPY PROCEDURE REPORT           PATIENT:  [name], Rich  MR#:  [id number]     BIRTHDATE:  12-26-1931, 78 yrs. old  GENDER:  male     ENDOSCOPIST:  Vania Rea. Jarold Motto, MD, [facility]     REF. BY:  Tillman Abide, M.D.     PROCEDURE DATE:  01/12/2011     PROCEDURE:  Diagnostic Colonoscopy     ASA CLASS:  Class III     INDICATIONS:  bright red bleeding, history of colon cancer     MEDICATIONS:   Fentanyl 25 mcg IV, Versed 3 mg IV           DESCRIPTION OF PROCEDURE:   After the risks benefits and     alternatives of the procedure were thoroughly explained, informed     consent was obtained.  Digital rectal exam was performed and     revealed no abnormalities.   The LB [id number] endoscope     was introduced through the anus and advanced to the anastomosis,     without limitations.  The quality of the prep was excellent, using     MoviPrep.  The instrument was then slowly withdrawn as the colon     was fully examined.     <<PROCEDUREIMAGES>>           FINDINGS:  Moderate diverticulosis was found in the sigmoid to     descending colon segments.  Internal Hemorrhoids were found in the     rectum.  A nodule was found in the rectum. hyperplastiv rectal     nodule.  No polyps or cancers were seen.  no active bleeding or     blood in c.   Retroflexed views in the rectum revealed internal     hemorrhoids.    The scope was then withdrawn from the patient and     the procedure completed.           COMPLICATIONS:  None     ENDOSCOPIC IMPRESSION:     1) Moderate diverticulosis in the sigmoid to descending colon     segments     2) Internal hemorrhoids in the rectum     3) Nodule in the rectum     4) No polyps or cancers     5) No  active bleeding or blood in c     6) Internal hemorrhoids     RECOMMENDATIONS:     1) high fiber diet     prn followup only per age and comorbid medical problems.     REPEAT EXAM:  No           ______________________________     Vania Rea. Jarold Motto, MD, [facility]           CC:  Monica Becton, Amy PA-C           n.     eSIGNED:   Vania Rea. Patterson at 01/12/2011 08:58 AM           [employer]ear Clock, [id number]  Note: An exclamation mark (!) indicates a result  that was not dispersed into the flowsheet. Document Creation Date: 01/12/2011 8:59 AM _______________________________________________________________________  (1) Order result status: Final Collection or observation date-time: 01/12/2011 08:51 Requested date-time:  Receipt date-time:  Reported date-time:  Referring Physician:   Ordering Physician: Sheryn Bison [id number]) Specimen Source:  Source: Launa Grill Order Number: [id number] Lab site:

## 2011-01-19 NOTE — Progress Notes (Signed)
   Phone Note Outgoing Call   Call placed by: Scherrie Bateman, LPN,  January 13, 2011 3:00 PM Call placed to: Patient Summary of Call: LEFT MESSAGE FOR PT TO CALL BACK DUE FOR ABD U/S F/U ANEURYSM. Initial call taken by: Scherrie Bateman, LPN,  January 13, 2011 3:01 PM  Follow-up for Phone Call        Phone call completed PT ACTUALLY NOT DUE FOR ABD U/S  TIL 08/2011. Follow-up by: Scherrie Bateman, LPN,  January 13, 2011 4:03 PM

## 2011-01-20 ENCOUNTER — Encounter: Payer: Self-pay | Admitting: Internal Medicine

## 2011-01-20 ENCOUNTER — Encounter (INDEPENDENT_AMBULATORY_CARE_PROVIDER_SITE_OTHER): Payer: Medicare Other

## 2011-01-20 DIAGNOSIS — I428 Other cardiomyopathies: Secondary | ICD-10-CM

## 2011-01-27 NOTE — Procedures (Signed)
Summary: PACER/AMD/NR      Allergies Added:   Current Medications (verified): 1)  Nadolol 20 Mg Tabs (Nadolol) .Marland Kitchen.. 1 Once Daily 2)  Aspir-Low 81 Mg Tbec (Aspirin) .Marland Kitchen.. 1 Once Daily 3)  Plavix 75 Mg Tabs (Clopidogrel Bisulfate) .... Take One By Mouth Daily 4)  Avodart 0.5 Mg  Caps (Dutasteride) .Marland Kitchen.. 1 Tab Once Daily 5)  Centrum Silver  Tabs (Multiple Vitamins-Minerals) .Marland Kitchen.. 1 By Mouth Once Daily 6)  Moviprep 100 Gm  Solr (Peg-Kcl-Nacl-Nasulf-Na Asc-C) 7)  Moviprep 100 Gm  Solr (Peg-Kcl-Nacl-Nasulf-Na Asc-C) .... As Per Prep Instructions.  Allergies (verified): 1)  ! Lipitor 2)  ! Zocor 3)  ! * Tape    ICD Specifications Following MD:  Sherryl Manges, MD     ICD Vendor:  [facility] Jude     ICD Model Number:  [phone]     ICD Serial Number:  [id number] ICD DOI:  06/01/2005     ICD Implanting MD:  Sherryl Manges, MD  Lead 1:    Location: RA     DOI: 02/20/1996     Model #: 6962     Serial #: [id number]     Status: active Lead 2:    Location: RV     DOI: 06/01/2005     Model #: [id number]     Serial #: [id number]     Status: active  Indications::  ICM  Explantation Comments: Pacemaker dependent  ICD Follow Up Remote Check?  No Battery Voltage:  2.55 V     Underlying rhythm:  no escape ICD Dependent:  Yes       ICD Device Measurements Atrium:  Amplitude: 1.4 mV, Impedance: 265 ohms, Threshold: 1.0 V at 0.6 msec Right Ventricle:  Impedance: 570 ohms, Threshold: 0.5 V at 0.5 msec  Episodes MS Episodes:  468     Percent Mode Switch:  <1%     Coumadin:  No Shock:  0     ATP:  0     Nonsustained:  0     Atrial Pacing:  51%     Ventricular Pacing:  100%  Brady Parameters Mode DDDR     Lower Rate Limit:  60     Upper Rate Limit 120 PAV 170     Sensed AV Delay:  150  Tachy Zones VF:  200     VT:  164     Tech Comments:  Atlas battery @ 2.55 since 12/10.   ROV later this month to discuss change out.  Altha Harm, LPN  January 20, 2011 9:13 AM

## 2011-01-31 ENCOUNTER — Other Ambulatory Visit: Payer: Self-pay | Admitting: Internal Medicine

## 2011-01-31 ENCOUNTER — Ambulatory Visit (INDEPENDENT_AMBULATORY_CARE_PROVIDER_SITE_OTHER): Payer: Medicare Other | Admitting: Internal Medicine

## 2011-01-31 ENCOUNTER — Encounter: Payer: Self-pay | Admitting: Internal Medicine

## 2011-01-31 DIAGNOSIS — E785 Hyperlipidemia, unspecified: Secondary | ICD-10-CM

## 2011-01-31 DIAGNOSIS — I10 Essential (primary) hypertension: Secondary | ICD-10-CM

## 2011-01-31 DIAGNOSIS — I2589 Other forms of chronic ischemic heart disease: Secondary | ICD-10-CM

## 2011-01-31 DIAGNOSIS — G3184 Mild cognitive impairment, so stated: Secondary | ICD-10-CM

## 2011-01-31 LAB — HEPATIC FUNCTION PANEL
AST: 25 U/L (ref 0–37)
Albumin: 3.6 g/dL (ref 3.5–5.2)
Alkaline Phosphatase: 74 U/L (ref 39–117)
Total Protein: 6.2 g/dL (ref 6.0–8.3)

## 2011-01-31 LAB — CBC WITH DIFFERENTIAL/PLATELET
Basophils Absolute: 0 10*3/uL (ref 0.0–0.1)
Basophils Relative: 0.5 % (ref 0.0–3.0)
Eosinophils Absolute: 0.1 10*3/uL (ref 0.0–0.7)
Eosinophils Relative: 2.3 % (ref 0.0–5.0)
HCT: 41.4 % (ref 39.0–52.0)
Hemoglobin: 14.2 g/dL (ref 13.0–17.0)
Lymphocytes Relative: 20 % (ref 12.0–46.0)
Lymphs Abs: 1.1 10*3/uL (ref 0.7–4.0)
MCHC: 34.3 g/dL (ref 30.0–36.0)
MCV: 100.6 fl — ABNORMAL HIGH (ref 78.0–100.0)
Monocytes Absolute: 0.7 10*3/uL (ref 0.1–1.0)
Monocytes Relative: 11.5 % (ref 3.0–12.0)
Neutro Abs: 3.8 10*3/uL (ref 1.4–7.7)
Neutrophils Relative %: 65.7 % (ref 43.0–77.0)
Platelets: 148 10*3/uL — ABNORMAL LOW (ref 150.0–400.0)
RBC: 4.11 Mil/uL — ABNORMAL LOW (ref 4.22–5.81)
RDW: 13.2 % (ref 11.5–14.6)
WBC: 5.8 10*3/uL (ref 4.5–10.5)

## 2011-01-31 LAB — TSH: TSH: 0.56 u[IU]/mL (ref 0.35–5.50)

## 2011-01-31 LAB — RENAL FUNCTION PANEL
Albumin: 3.6 g/dL (ref 3.5–5.2)
CO2: 31 mEq/L (ref 19–32)
Calcium: 9.6 mg/dL (ref 8.4–10.5)
Chloride: 101 mEq/L (ref 96–112)
Phosphorus: 3.2 mg/dL (ref 2.3–4.6)
Potassium: 4.7 mEq/L (ref 3.5–5.1)

## 2011-02-04 ENCOUNTER — Encounter: Payer: Medicare Other | Admitting: Internal Medicine

## 2011-02-05 ENCOUNTER — Encounter: Payer: Self-pay | Admitting: Internal Medicine

## 2011-02-08 NOTE — Assessment & Plan Note (Signed)
Summary: 6 m f/u dlo   Vital Signs:  Patient profile:   77 year old male Weight:      144 pounds Temp:     98.7 degrees F oral Pulse rate:   60 / minute Pulse rhythm:   regular BP sitting:   116 / 57  (left arm) Cuff size:   large  Vitals Entered By: Mervin Hack CMA Duncan Dull) (January 31, 2011 10:44 AM) CC: follow-up   History of Present Illness: Reviewed the benign colonoscopy No further rectal bleeding  No heart problems No chest pain Breathing is okay Stays active  outside  Memory is stable Still handles financials affairs Wife notes some confusion at times--- trouble getting out "the right word", esp in afternoon if tired.  Only brief lapses  No urinary problems Stable 1-2 nocturia No daytime issues  Allergies: 1)  ! Lipitor 2)  ! Zocor 3)  ! * Tape  Past History:  Past medical, surgical, family and social histories (including risk factors) reviewed for relevance to current acute and chronic problems.  Past Medical History: Congestive heart failure----ischemic cardiomyopathy Coronary artery disease-S/P CABG Diverticulosis, colon GERD Benign prostatic hypertrophy--------------------------------------Dr Cope Heart block--pacer/defib 6/06--------------------------------------Dr Graciela Husbands AAA Hyperlipidemia Colon cancer, hx of 1993 Mild cognitive impairment  Past Surgical History: Reviewed history from 11/23/2010 and no changes required. CABG  1997 RESECTION R COLON  1993-CECAL CANCER RECURRANT HYPERPLASTIC POLYPS Cholecystectomy CARDILITE  NEGATIVE  1/01 PACEMAKER 02/1996 PROSTATE BX  NEGITIVE  03/2003 ADENSONE STRESS- FIXED AMT DEFECT LV 30% (DILATED)  05/2005 PACER /ICD (TYLER) 05/2005 - ST Jude Atlas Plus ADENOSOINE-MYOVIEW- LOW RISK 11/2006 CATH --NO SIG PROBLEMS  12/2006  Family History: Reviewed history from 09/03/2009 and no changes required. Mom and Dad died of old age Sister died of insulin reaction. 2 brothers--also deceased     Social History: Reviewed history from 09/03/2009 and no changes required. 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   Neck:  supple, no masses, no thyromegaly, no carotid bruits, and no cervical lymphadenopathy.   Lungs:  normal respiratory effort, no intercostal retractions, no accessory muscle use, and normal breath sounds.   Heart:  normal rate, regular rhythm, no murmur, and no gallop.   Abdomen:  soft and non-tender.   Extremities:  no edema Neurologic:  alert Jan 31, 2001 100-93-84-74 Presidents--"Obama, that [place] guy" Psych:  normally interactive, good eye contact, not anxious appearing, and not depressed appearing.     Impression & Recommendations:  Problem # 1:  MILD COGNITIVE IMPAIRMENT SO STATED (ICD-331.83) Assessment Deteriorated may have slight progression Still no sig functional decline or loss wife is monitoring  Problem # 2:  CARDIOMYOPATHY, ISCHEMIC S/P CABG NL EF (ICD-414.8) Assessment: Unchanged heart status is stable no changes in exercise tolerance  His updated medication list for this problem includes:    Nadolol 20 Mg Tabs (Nadolol) .Marland Kitchen... 1 once daily    Aspir-low 81 Mg Tbec (Aspirin) .Marland Kitchen... 1 once daily    Plavix 75 Mg Tabs (Clopidogrel bisulfate) .Marland Kitchen... Take one by mouth daily  Problem # 3:  HYPERTENSION (ICD-401.9) Assessment: Unchanged  good control due for labs  His updated medication list for this problem includes:    Nadolol 20 Mg Tabs (Nadolol) .Marland KitchenMarland KitchenMarland KitchenMarland Kitchen  1 once daily  BP today: 116/57 Prior BP: 90/62 (11/23/2010)  Labs Reviewed: K+: 4.8 (09/03/2009) Creat: : 1.0 (09/03/2009)   Chol: 191 (07/07/2008)   HDL: 39.7 (07/07/2008)   LDL: 130 (07/07/2008)   TG: 106  (07/07/2008)  Orders: TLB-Renal Function Panel (80069-RENAL) TLB-CBC Platelet - w/Differential (85025-CBCD) TLB-Hepatic/Liver Function Pnl (80076-HEPATIC) TLB-TSH (Thyroid Stimulating Hormone) (84443-TSH) Venipuncture (16109)  Problem # 4:  HYPERLIPIDEMIA (ICD-272.4) statin stopped due to possible cognitive changes  Labs Reviewed: SGOT: 23 (09/03/2009)   SGPT: 20 (09/03/2009)   HDL:39.7 (07/07/2008), 28.8 (05/14/2007)  LDL:130 (07/07/2008), 128 (05/14/2007)  Chol:191 (07/07/2008), 176 (05/14/2007)  Trig:106 (07/07/2008), 95 (05/14/2007)  Problem # 5:  BENIGN PROSTATIC HYPERTROPHY (ICD-600.00) Assessment: Unchanged voids okay on med  Complete Medication List: 1)  Nadolol 20 Mg Tabs (Nadolol) .Marland Kitchen.. 1 once daily 2)  Aspir-low 81 Mg Tbec (Aspirin) .Marland Kitchen.. 1 once daily 3)  Plavix 75 Mg Tabs (Clopidogrel bisulfate) .... Take one by mouth daily 4)  Avodart 0.5 Mg Caps (Dutasteride) .Marland Kitchen.. 1 tab once daily 5)  Centrum Silver Tabs (Multiple vitamins-minerals) .Marland Kitchen.. 1 by mouth once daily  Patient Instructions: 1)  Please schedule a follow-up appointment in 6 months .    Orders Added: 1)  TLB-Renal Function Panel [80069-RENAL] 2)  TLB-CBC Platelet - w/Differential [85025-CBCD] 3)  TLB-Hepatic/Liver Function Pnl [80076-HEPATIC] 4)  TLB-TSH (Thyroid Stimulating Hormone) [84443-TSH] 5)  Venipuncture [60454] 6)  Est. Patient Level IV [09811]    Current Allergies (reviewed today): ! LIPITOR ! ZOCOR ! * TAPE

## 2011-02-17 NOTE — Cardiovascular Report (Signed)
Summary: Office Visit   Office Visit   Imported By: Roderic Ovens 02/11/2011 11:43:08  _____________________________________________________________________  External Attachment:    Type:   Image     Comment:   External Document

## 2011-04-12 ENCOUNTER — Encounter: Payer: Self-pay | Admitting: Internal Medicine

## 2011-04-12 ENCOUNTER — Ambulatory Visit (INDEPENDENT_AMBULATORY_CARE_PROVIDER_SITE_OTHER): Payer: Medicare Other | Admitting: Internal Medicine

## 2011-04-12 DIAGNOSIS — Z95810 Presence of automatic (implantable) cardiac defibrillator: Secondary | ICD-10-CM

## 2011-04-12 DIAGNOSIS — I2510 Atherosclerotic heart disease of native coronary artery without angina pectoris: Secondary | ICD-10-CM

## 2011-04-12 DIAGNOSIS — I442 Atrioventricular block, complete: Secondary | ICD-10-CM

## 2011-04-12 DIAGNOSIS — I2589 Other forms of chronic ischemic heart disease: Secondary | ICD-10-CM

## 2011-04-12 DIAGNOSIS — Z950 Presence of cardiac pacemaker: Secondary | ICD-10-CM | POA: Insufficient documentation

## 2011-04-12 DIAGNOSIS — I509 Heart failure, unspecified: Secondary | ICD-10-CM

## 2011-04-12 NOTE — Progress Notes (Signed)
  HPI  [name] Rich is a 77 y.o. male Seen in followup for complete heart block in the setting of ischemic myopathy. He is status post ICD implantation and he is approaching ERI.  Since his last visit 2 months ago he has been relatively stable. He denies any significant chest pains or shortness of breath. He is not playing golf.  Past Medical History  Diagnosis Date  . CHF (congestive heart failure)   . Coronary artery disease     s/p CABG  . Ischemic cardiomyopathy   . Colon, diverticulosis   . GERD (gastroesophageal reflux disease)   . BPH (benign prostatic hyperplasia)     Dr. Achilles Dunk  . Heart block     pacer/defib 6/06-----Dr. Graciela Husbands  . AAA (abdominal aortic aneurysm)   . Hyperlipidemia   . Colon cancer 1993  . Cognitive impairment     mild    Past Surgical History  Procedure Date  . Coronary artery bypass graft 1997  . Colon resection     R COLON - Cecal cancer  . Cholecystectomy   . Pacemaker insertion 02/1996  . Prostate biopsy 03/2003    negative  . Icd 05/2005    (TYLER) St Jude Atlas Plus  . Cardiac catheterization   . Insert / replace / remove pacemaker 2006    pacer/defib  (St Jude)    Current Outpatient Prescriptions  Medication Sig Dispense Refill  . aspirin 81 MG tablet Take 81 mg by mouth daily.        . clopidogrel (PLAVIX) 75 MG tablet Take 75 mg by mouth daily.        Marland Kitchen dutasteride (AVODART) 0.5 MG capsule Take 0.5 mg by mouth daily.        . Multiple Vitamins-Minerals (CENTRUM SILVER PO) Take 1 tablet by mouth daily.        . nadolol (CORGARD) 20 MG tablet Take 20 mg by mouth daily.          Allergies  Allergen Reactions  . Atorvastatin     REACTION: confusion  . Simvastatin     REACTION: zombie    Review of Systems negative except from HPI and PMH  Physical Exam Well developed and well nourished in no acute distress HENT normal E scleral and icterus clear Neck Supple JVP flat; carotids brisk and full Clear to ausculation Regular rate  and rhythm, a 2/6 murmur along the sternal border Soft with active bowel sounds No clubbing cyanosis and edema Alert and oriented, grossly normal motor and sensory function Skin Warm and Dry     Assessment and  Plan

## 2011-04-12 NOTE — Assessment & Plan Note (Signed)
The patient's device was interrogated.  The information was reviewed. No changes were made in the programming.    We'll see him again as he approaches ER eye. Will plan to contact St. Jude to see if we get a better handle on the end-of-life characteristics

## 2011-04-12 NOTE — Assessment & Plan Note (Signed)
Stable continue current medications

## 2011-04-12 NOTE — Patient Instructions (Signed)
We will call you with lab results that were drawn today. Your physician recommends that you schedule a follow-up appointment in: 2 months with Gunnar Fusi for device check:

## 2011-04-12 NOTE — Assessment & Plan Note (Signed)
Device dependent. 

## 2011-04-13 ENCOUNTER — Encounter: Payer: Self-pay | Admitting: Internal Medicine

## 2011-04-13 ENCOUNTER — Ambulatory Visit (INDEPENDENT_AMBULATORY_CARE_PROVIDER_SITE_OTHER): Payer: Medicare Other | Admitting: Internal Medicine

## 2011-04-13 VITALS — BP 112/60 | HR 61 | Temp 98.8°F | Wt 140.0 lb

## 2011-04-13 DIAGNOSIS — S20219A Contusion of unspecified front wall of thorax, initial encounter: Secondary | ICD-10-CM

## 2011-04-13 LAB — TSH: TSH: 0.599 u[IU]/mL (ref 0.350–4.500)

## 2011-04-13 NOTE — Progress Notes (Signed)
  Subjective:    Patient ID: [name] Rich, male    DOB: 1932/02/09, 77 y.o.   MRN: [id number]  HPI Larey Seat about 2-3 weeks ago Was playing with grandkids and slipped on pine straw Came down on ground on left side Was able to get right up--no sig pain initially  Notes ongoing pain along left ribs Intermittent --may be worse upon getting up out of chair Not pleuritic  SOme cough due to mowing No fever or SOB  Current outpatient prescriptions:aspirin 81 MG tablet, Take 81 mg by mouth daily.  , Disp: , Rfl: ;  clopidogrel (PLAVIX) 75 MG tablet, Take 75 mg by mouth daily.  , Disp: , Rfl: ;  dutasteride (AVODART) 0.5 MG capsule, Take 0.5 mg by mouth daily.  , Disp: , Rfl: ;  Multiple Vitamins-Minerals (CENTRUM SILVER PO), Take 1 tablet by mouth daily.  , Disp: , Rfl: ;  nadolol (CORGARD) 20 MG tablet, Take 20 mg by mouth daily.  , Disp: , Rfl:   Past Medical History  Diagnosis Date  . CHF (congestive heart failure)   . Coronary artery disease     s/p CABG  . Ischemic cardiomyopathy   . Colon, diverticulosis   . GERD (gastroesophageal reflux disease)   . BPH (benign prostatic hyperplasia)     Dr. Achilles Dunk  . Heart block     pacer/defib 6/06-----Dr. Graciela Husbands  . AAA (abdominal aortic aneurysm)   . Hyperlipidemia   . Colon cancer 1993  . Cognitive impairment     mild    Past Surgical History  Procedure Date  . Coronary artery bypass graft 1997  . Colon resection     R COLON - Cecal cancer  . Cholecystectomy   . Pacemaker insertion 02/1996  . Prostate biopsy 03/2003    negative  . Icd 05/2005    (TYLER) St Jude Atlas Plus  . Cardiac catheterization   . Insert / replace / remove pacemaker 2006    pacer/defib  (St Jude)    No family history on file.  History   Social History  . Marital Status: Married    Spouse Name: N/A    Number of Children: 3  . Years of Education: N/A   Occupational History  . retired [employer]    Social History Main Topics  . Smoking status: Former  Smoker -- 1.0 packs/day    Types: Cigarettes    Quit date: 03/13/1996  . Smokeless tobacco: Not on file  . Alcohol Use: No  . Drug Use: Not on file  . Sexually Active: Not on file   Other Topics Concern  . Not on file   Social History Narrative  . No narrative on file   Review of Systems No nausea or vomiting Bowels okay Appetite is okay Still losing weight despite good appetite    Objective:   Physical Exam  Constitutional: He appears well-developed and well-nourished. No distress.  Neck: Neck supple. No thyromegaly present.  Pulmonary/Chest: Effort normal and breath sounds normal. No respiratory distress. He has no wheezes. He has no rales. He exhibits no tenderness.       Pain area is below 12 rib on left--anterior axillary line  Abdominal: Soft. He exhibits no mass. There is no tenderness.       No HSM          Assessment & Plan:

## 2011-04-13 NOTE — Patient Instructions (Addendum)
Please try whole milk with carnation instant breakfast with nabs or peanut butter for snacks and continue to eat normal meals

## 2011-04-15 ENCOUNTER — Ambulatory Visit (INDEPENDENT_AMBULATORY_CARE_PROVIDER_SITE_OTHER): Payer: Medicare Other | Admitting: Internal Medicine

## 2011-04-15 ENCOUNTER — Encounter: Payer: Self-pay | Admitting: Internal Medicine

## 2011-04-15 VITALS — BP 130/62 | HR 78 | Temp 99.1°F | Ht 71.0 in | Wt 140.1 lb

## 2011-04-15 DIAGNOSIS — R05 Cough: Secondary | ICD-10-CM

## 2011-04-15 NOTE — Progress Notes (Signed)
  Subjective:    Patient ID: [name] Rich, male    DOB: 16-Oct-1932, 77 y.o.   MRN: [id number]  HPI Was mowing yesterday Stopped due to very hot weather Then he coughed all night--was concerned Felt okay before this  Having nasal congestion and runny nose Not much mucus now--did have spitting out PND (clear) No fever  No ear pain No sore throat  Has been loratadine 10mg  daily since March--spring allergies this season  Current outpatient prescriptions:aspirin 81 MG tablet, Take 81 mg by mouth daily.  , Disp: , Rfl: ;  clopidogrel (PLAVIX) 75 MG tablet, Take 75 mg by mouth daily.  , Disp: , Rfl: ;  dutasteride (AVODART) 0.5 MG capsule, Take 0.5 mg by mouth daily.  , Disp: , Rfl: ;  Multiple Vitamins-Minerals (CENTRUM SILVER PO), Take 1 tablet by mouth daily.  , Disp: , Rfl: ;  nadolol (CORGARD) 20 MG tablet, Take 20 mg by mouth daily.  , Disp: , Rfl:   Past Medical History  Diagnosis Date  . CHF (congestive heart failure)   . Coronary artery disease     s/p CABG  . Ischemic cardiomyopathy   . Colon, diverticulosis   . GERD (gastroesophageal reflux disease)   . BPH (benign prostatic hyperplasia)     Dr. Achilles Dunk  . Heart block     pacer/defib 6/06-----Dr. Graciela Husbands  . AAA (abdominal aortic aneurysm)   . Hyperlipidemia   . Colon cancer 1993  . Cognitive impairment     mild    Past Surgical History  Procedure Date  . Coronary artery bypass graft 1997  . Colon resection     R COLON - Cecal cancer  . Cholecystectomy   . Pacemaker insertion 02/1996  . Prostate biopsy 03/2003    negative  . Icd 05/2005    (TYLER) St Jude Atlas Plus  . Cardiac catheterization   . Insert / replace / remove pacemaker 2006    pacer/defib  (St Jude)    No family history on file.  History   Social History  . Marital Status: Married    Spouse Name: N/A    Number of Children: 3  . Years of Education: N/A   Occupational History  . retired [employer]    Social History Main Topics  . Smoking  status: Former Smoker -- 1.0 packs/day    Types: Cigarettes    Quit date: 03/13/1996  . Smokeless tobacco: Not on file  . Alcohol Use: No  . Drug Use: Not on file  . Sexually Active: Not on file   Other Topics Concern  . Not on file   Social History Narrative  . No narrative on file   Review of Systems No vomiting or diarrhea Eating okay    Objective:   Physical Exam  Constitutional: He appears well-developed and well-nourished. No distress.  HENT:  Head: Normocephalic and atraumatic.  Mouth/Throat: Oropharynx is clear and moist. No oropharyngeal exudate.       TMs both normal Marked pale congestion in right nare, and mild in left  Neck: Normal range of motion.  Pulmonary/Chest: Effort normal and breath sounds normal. No respiratory distress. He has no wheezes. He has no rales.  Lymphadenopathy:    He has no cervical adenopathy.          Assessment & Plan:

## 2011-04-15 NOTE — Patient Instructions (Addendum)
Please try cetirizine 10mg  daily for cough and allergy drainage if symptoms persist despite the loratadine

## 2011-04-25 ENCOUNTER — Telehealth: Payer: Self-pay | Admitting: *Deleted

## 2011-04-25 NOTE — Telephone Encounter (Signed)
Okay to send Rx for tramadol 50mg  tid prn for cough #20 x 0

## 2011-04-25 NOTE — Telephone Encounter (Signed)
Pt was seen about 10 days ago, dx'd with allergy symptoms.  His cough is not any better and his wife is asking that something be called in for the cough, which is non productive.   He is taking claritin and zyrtec.   [facility] [address]

## 2011-04-26 MED ORDER — TRAMADOL HCL 50 MG PO TABS: ORAL_TABLET | ORAL | Status: DC

## 2011-04-26 NOTE — Progress Notes (Signed)
[facility] OFFICE NOTE   NAME:Mehan, DAXEN LANUM                    MRN:          [id number]  DATE:05/12/2008                            DOB:          1932-05-29    Mr. Espina is seen today in followup for coronary disease and previously  implanted ICD in the setting of complete heart block.  He denies  complains of shortness of breath.  He is having no chest pain nor any  lightheadedness.   He has intercurently been diagnosed with an elevated PSA.   His medications currently include nadolol at 20, aspirin, Plavix 75 and  Avodart.  He is also taking ready yeast rice every other day for his  dyslipidemia..   On examination his blood pressure is 120/70, his pulse is 73.  His lungs  are clear.  Heart sounds were regular.  The extremities were without  edema.   Interrogation of his St. Jude ICD demonstrates no intrinsic ventricular  rhythm with impedance of 500, threshold of 0.5 and 0.5.  The P wave was  10.1 with impedance of 335 and threshold of 1.25 and 0.6.  Charge time  was 10.4.  __________ volt was 2.95 and need is dependent.   IMPRESSION:  1. Syncope.  2. Progressive conduction system disease, now device dependent.  3. Ischemic cardiomyopathy with previous bypass grafting and      intercurrent of circumflex stenting.  4. Status post implantable cardioverter defibrillator for the above.   Mr. Caperton is doing very well from a cardiac point of view.  There is no  evidence of congestive heart failure notwithstanding his significant  ventricular pacing.  I do not think there is any indication currently to  proceed with left ventricular upgrade; I do think we will need to be  quite attuned to symptoms of heart failure developing, however.   We will see him again in three months' time.     Duke Salvia, MD, [facility]  Electronically Signed    SCK/MedQ  DD: 05/12/2008  DT: 05/12/2008  Job #:  [id number]   cc:   Karie Schwalbe, MD

## 2011-04-26 NOTE — Telephone Encounter (Signed)
[facility]  

## 2011-04-26 NOTE — Progress Notes (Signed)
[facility] OFFICE NOTE   NAME:[name], [name] [name] [name]                    MRN:          [id number]  DATE:08/04/2008                            DOB:          [birth year]    HISTORY OF PRESENT ILLNESS:  [name] [name] comes in followup for his ICD  implanted for syncope in the setting of ischemic heart disease,  depressed LV function.  He is feeling well.  He has no complaints of  chest pain or shortness of breath.   Review of his medication and his records failed to identify a  contraindication for ACE inhibitor therapy.   MEDICATIONS:  1. Plavix 75.  2. Aspirin.  3. Nadolol 20.  4. Red Yeast Rice.  5. Avodart.   PHYSICAL EXAMINATION:  VITAL SIGNS:  Today his blood pressure 125/72  with a pulse of 63.  LUNGS:  Clear.  HEART:  Heart sounds were regular.  NECK:  Neck veins were flat.  EXTREMITIES:  Without edema.   Interrogation of St. Jude ICD demonstrates P-wave of 2.4 with impedance  of 345, a threshold of 1 volt 0.6, the R-wave was dependent.  There is  impedance of 490 and threshold 0.5 at 0.5.   IMPRESSION:  1. Ischemic heart disease with      a.     Prior  bypass grafting.      b.     Intercurrent circumflex stenting.      c.     Depressed left ventricular function.  2. Prior syncope.  3. Progressive conduction system disease now device dependent.  4. Status post implantable cardioverter-defibrillator for the above.  5. No ACE inhibitor therapy.   [name] [name] is doing well.  We had a lengthy discussion on the potential  benefits of ACE inhibitor therapy from a mortality and heart failure  symptoms point-of-view.   He had at this point would like to consider this further, but is not  ready to embark on therapy.  We will plan to discuss with him at his  next visit.     Duke Salvia, MD, [facility]  Electronically Signed    SCK/MedQ  DD: 08/04/2008  DT: 08/05/2008  Job #: [id number]

## 2011-04-26 NOTE — Assessment & Plan Note (Signed)
[facility]                         ELECTROPHYSIOLOGY OFFICE NOTE   NAME:WEBBMorry, Veiga                    MRN:          [id number]  DATE:01/18/2008                            DOB:          [birth year]    [name] [name] comes in with his wife today with a variety of concerns.  He is  status post pacemaker implantation for high-grade heart block upgraded  to an ICD during a period of time where he had significant LV  dysfunction.  He is status post bypass surgery and subsequent vein graft  stenting in 2006 for which he was put on and has remained on Plavix.  His wife's first concern is whether he can come off of Plavix because  she has heard from others that it is associated with a potential for  confusion and this has been a real issue.   His statin has already been discontinued in the interim without  significant improvement.  He has had no problems with chest pain or  shortness of breath.  He is not all that active currently.   His other medications in addition to the above include nadolol 20 as  well as aspirin and Avodart recently started for an elevated PSA.   On examination, his blood pressure is 130/69, his pulse was 63.  His  lungs were clear, heart sounds were regular, the extremities were  without edema.  Interrogation of his St. Jude ICD demonstrates a P wave  of 2.2 with impedance of 365, a threshold 0.25 at 0.6 with no intrinsic  ventricular rhythm, impedance of 510 with threshold of  volt at 0.5.   IMPRESSION:  1. Complete heart block.  2. Status post pacer with subsequent upgrade to an internal      cardioverter-defibrillator.  3. Ischemic heart disease.      a.     Status post coronary artery bypass grafting.      b.     Status post circumflex stenting in 2006.      c.     Previously depressed, now normal left ventricular function.  4. Confusion.   Mrs. [name] [name]'s concern about confusion and its relationship to Plavix is  something of  which I am unaware of.  I asked Dr. Tonny Bollman who was  also unaware of it but she says it was on the web site and was related  to something called TP; obviously, this is a different entity.  I told  her I would explore this for her.   In addition, we reviewed potential issues related to acute stent  thrombosis in the setting of discontinuation and while I told her it was  infrequent she surmised that it would be potentially catastrophic and I  could not argue with this and so she has decided that she would continue  him on his Plavix.   I suggested that they consider the memory center at either [facility]  or [facility] to help assess and to see if there are any interventions  that might be pursuable given his memory issues.   We will see him again three months' time  in [place].     Duke Salvia, MD, [facility]  Electronically Signed    SCK/MedQ  DD: 01/18/2008  DT: 01/19/2008  Job #: [id number]   cc:   Karie Schwalbe, MD

## 2011-04-26 NOTE — Assessment & Plan Note (Signed)
[facility]                         ELECTROPHYSIOLOGY OFFICE NOTE   NAME:Lavin, MAJOR SANTERRE                    MRN:          [id number]  DATE:07/02/2007                            DOB:          1932-09-21    Mr. Carneiro comes in today.  He has ischemic heart disease.  He is doing  quite well.  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   He has a funny sensation 30 minutes after taking his beta-blocker which  he has been on now for quite a number of years.   He is not taking a statin because of a variety of side effects.   PHYSICAL EXAMINATION:  VITAL SIGNS:  Blood pressure 131/70, pulse 65.  LUNGS:  Clear.  HEART:  Heart sounds were regular.  EXTREMITIES:  Without edema.   Interrogation of his St. Jude ICD demonstrates a P wave of 2.4 with  impedance of 440, threshold of 1 volt at 0.6.  There was no intrinsic  ventricular rhythm.  The ventricular impedance was 500, threshold of  0.75 at 0.5.  Battery voltage was 3.15.   IMPRESSION:  1. Ischemic heart disease.      a.     Status post bypass grafting.      b.     Status post stenting.  2. Complete heart block.  3. Status post previously implanted pacemaker with upgrade to an      implantable cardioverter-defibrillator because of depressed left      ventricular function, now with improvement in left ventricular      function.  4. Intolerance of statins.   Mr. Bartoszek is stable from a cardiac arrhythmia point of view.  I have  suggested that he think about yeast rice as an alternative to statin  therapy.  He is agreeable to look into that.  I will see him again in 6  months.     Duke Salvia, MD, [facility]  Electronically Signed    SCK/MedQ  DD: 07/02/2007  DT: 07/02/2007  Job #: [id number]

## 2011-04-26 NOTE — Progress Notes (Signed)
[facility] OFFICE NOTE   NAME:Lagace, SHYLER HOLZMAN                    MRN:          [id number]  DATE:01/19/2009                            DOB:          [birth year]    [name] is seen in followup for ischemic heart disease, complete heart  block, prior syncope, and status post ICD.  He has had no complaints of  shortness of breath.  He does relate an episode, however, when he was  shoveling snow where he had transient left-sided arm pain that was  relieved by rest.  He has had no other discomfort.  He walks a mile a  day with a bunch of other guys, but this no where compared to the  hardest work that he does.   MEDICATIONS:  1. Plavix 75 for which he needs a refill.  2. Aspirin.  3. Nadolol 20.  4. Red yeast rice.  5. Avodart.  6. He is not on an ACE inhibitor.  We discussed this last time and he      chooses not to take it.   PHYSICAL EXAMINATION:  VITAL SIGNS:  His blood pressure is 118/60.  His  pulse is 62.  His weight is about 155.  GENERAL:  He is in no acute distress.  NECK:  Veins were flat.  LUNGS:  Clear.  HEART:  Sounds were regular without murmurs or gallops.  ABDOMEN:  Soft.  EXTREMITIES:  Without edema.   Interrogation of St. Jude Atlas ICD demonstrates P-wave of 1.5 with  impedance of 325.  The threshold was 1 volt at 0.6.  There was no  intrinsic ventricular rhythm.  The impedance was 520 and threshold was  0.5 at 0.5.  Battery voltage is 2.65.   IMPRESSION:  1. Syncope.  2. Complete heart block.  3. Ischemic heart disease with;      a.     Catheterization in 2008 with patent grafts.  Ejection       fraction was 45%.      b.     Apical wall hypokinesis.      c.     Status post implantable cardioverter-defibrillator for the       above.  4. Disinclination to take the angiotensin-converting enzyme inhibitor      therapy.   Mr. Ewan chest discomfort is disconcerting.  He  had a cath about 2  years ago.  We will need to compare the Myoview very carefully with the  one that was apparently a false positive 2 years ago.   Based on his ejection fraction, we may try to broach the subject of ACE  inhibitor therapy again.   We will renew his Plavix today.  We will see him again in 6 months'  time.     Duke Salvia, MD, [facility]  Electronically Signed    SCK/MedQ  DD: 01/19/2009  DT: 01/20/2009  Job #: [id number]

## 2011-04-26 NOTE — Letter (Signed)
May 06, 2009    Karie Schwalbe, MD  [address] [place], [place] [address]   RE:  [name] [name], [name] [name]  MRN:  [id number]  /  DOB:  07/29/1932   Dear Gerlene Burdock,   I saw [name] [name] in the office today.  [name] [name] comes in today having been  seen last week in the office for his defibrillator check which was okay  with a variety of concerns dating back to his stress test in April.  At  that time, he had a significant pressure sensation in his head during  the adenosine infusion.  It terminated.  This was different from his  prior Myoview scans which I presume were adenosine, although I do not  have that information here in this chart.   He has had no significant problems with ongoing chest pain.  I should  note that his Myoview was nonischemic (false positive before) and with  improvement in his LV systolic function now measured at 56%.   He also has a sensation of palpitations in his head which he dates to  the adenosine infusion.  He is not sure whether these are regular or  irregular, fast or slow.   His wife also has a concern about significant daytime fatigue.  He sits  around lot of the day and does nothing and goes to bed even before it is  dark.  Sometimes, he goes plays golf, sometimes he works in the yard.  He does have significant sleep disordered breathing to the degree she  has moved to a different part of the house.  He underwent sleep studies  apparently 10 years ago at [facility] that were negative.  She raises a  question about concomitant depression.   His current medications include Plavix, aspirin, nadolol, and Avodart.   On examination, his blood pressure 145/74, his pulse was 65.  His weight  was 155.  He was alert and oriented in no distress.  His skin was warm  and dry.  His neck veins were flat.  His lungs were clear.  Heart sounds  were regular.  Abdomen was soft with active bowel sounds.  There was no  peripheral edema.  Neurological exam was grossly  normal.   IMPRESSION:  1. Ischemic heart disease with:      a.     Prior bypass grafting and percutaneous coronary intervention       of bypass grafts.      b.     Normal left ventricular function by recent Myoview.      c.     Modest perfusion defect described as ischemia but with a       previously known false positive.  2. Fatigue.  3. Sleep disordered breathing.  4. Palpitations.   [name] [name] is doing fine from a VT point of view.  The palpitations that  he is having may reflect PVCs or some other arrhythmia.  I told him in  the context of his normal left ventricular function, the prognostic  implications are minimal.  In the event that they continue as  problematic, we could use an event recorder to elucidate the cause.   As relates to his fatigue, a couple of thoughts came to mind, the first  was whether the nadolol was contributing to it.  To that end, we will  hold the nadolol for a couple of weeks and see how it is that he does.  At 20 mg a day, I  think we can just go ahead and stop it for that short  period.  In addition, we will undertake a Nightwatch monitor to look for  evidence of sleep apnea.  Given the fact also that his wife is relating  of depression in the event that the aforementioned testing does not help  [place] understand his fatigue, I suggest that they follow up with you for  evaluation for depression.   Richard, thank you very much for asking [place] to participate is the care of  this patient.    Sincerely,      Duke Salvia, MD, [facility]  Electronically Signed    SCK/MedQ  DD: 05/06/2009  DT: 05/07/2009  Job #: [id number]

## 2011-04-29 NOTE — Discharge Summary (Signed)
NAMELARENCE, THONE NO.:  [id number]   MEDICAL RECORD NO.:  [id number]          PATIENT TYPE:  INP   LOCATION:  6522                         FACILITY:  [facility]   PHYSICIAN:  Doylene Canning. Ladona Ridgel, M.D.  DATE OF BIRTH:  1932/08/31   DATE OF ADMISSION:  05/31/2005  DATE OF DISCHARGE:  06/02/2005                                 DISCHARGE SUMMARY   DISCHARGE DIAGNOSES:  1.  Adenosine Cardiolite study May 25, 2005 showing a mild defect in the      anterior wall from the mid ventricle to the apex, apex is dyskinetic,      ejection fraction 30%, abnormal perfusion scan, the patient complains of      chest tightness and exertional chest discomfort.  2.  Discharging day #1 status post explant of existing St. Jude Pacer placed      for complete heart block with implantation of St. Jude ATLAS + DR DDD      cardioverter defibrillator.  3.  Discharging day #2 status post drug-eluting stent to the distal      saphenous vein graft to a second obtuse marginal reducing a 75% stenosis      to 0.  4.  Discharging day #2 status post left heart catheterization.   SECONDARY DIAGNOSES:  1.  History of syncope.  2.  History of coronary artery disease status post coronary artery bypass      graft surgery.   PROCEDURE:  1.  May 25, 2005 -- adenosine Cardiolite study.  There was a mild defect      interior wall which is fixed, mid ventricle to apex, ejection fraction      30%.  2.  January 31, 2005 -- left heart catheterization Dr. Charlton Haws.  Left      main had 20% stenosis, LAD was 100% occluded, left circumflex is      dominant, the ramus intermediate is 30-40%, second obtuse marginal is      occluded, the right coronary artery has 30% midpoint stenosis.  He has      grafts placed, graft angiography showed the saphenous vein graft to the      diagonal 30%, midpoint, saphenous vein graft to the obtuse marginal 70%      distal, the LIMA to the LAD is normal, ejection fraction 30-35%  with      anterior apical hypokinesis.  3.  May 31, 2005 -- PCI with placement of drug-eluting stent the distal      saphenous vein graft to the second obtuse marginal reducing a 75% lesion      to 0.  The patient will be on aspirin 325 mg and Plavix for one year.  4.  June 01, 2005 -- implant/explantation of an existing St. Jude DDD      pacemaker with implantation of St. Jude ATLAS cardioverter defibrillator      with defibrillator threshold study less than or equal to 25 joules, Dr.      Lewayne Bunting practitioner.   DISCHARGE DISPOSITION:  The patient discharging day #1 status post  implantation of  pacemaker.  The patient is in sinus rhythm, is complaining  of very little chest pain, he is not short of breath achieving oxygen  saturation 93% on room air.  His incision looks good without erythema,  swelling or drainage.  Device has been interrogated post procedure day #1  with all values within normal limits.  Chest x-ray shows leads in  appropriate position.  His complete blood count on June 02, 2005 -- white  cells 7.4, hemoglobin 13.2, hematocrit 37.9 and platelets 143.  Serum  electrolytes June 01, 2005 -- sodium 128, potassium 3.7, chloride 97,  carbonate 26, BUN 8, creatinine 0.9, glucose 90.   The patient discharged on the following medications:  1.  Keflex 500 mg 1 tablet four times daily x5 days.  Suggest that he take      it at breakfast, lunch, dinner and bedtime.  2.  Nadolol 20 mg daily.  3.  Enteric-coated aspirin 325 mg daily.  4.  Plavix 75 mg.  5.  Multivitamin daily.  6.  Zocor 20 mg 1/2 tablet every other day.  7.  Pepcid 10 mg daily.   The patient will have follow-up at [facility], [address] for both post catheterization check and ICD Clinic on Wednesday June 15, 2005 at 9:45 in the morning.  He will see Dr. Lewayne Bunting in September  2006 for an ICD adjustment.  This appointment will be called to the patient.   BRIEF HISTORY:  Mr.  Jablonowski is a 77 year old male followed by Dr. Sherryl Manges  who presented with chest discomfort and increasing chest tightness with  exertion.  He underwent adenosine Cardiolite study on May 25, 2005, this  study was abnormal and the patient was admitted for elective left heart  catheterization on May 31, 2005 with implantation of cardioverter  defibrillator to follow.  The patient has a prior history of myocardial  infarction as well as coronary artery disease, he is status post coronary  artery bypass graft surgery in 1997.  He also has a St. Jude pacemaker in  place for complete heart block.  The patient has had no prior history of  syncope and admitted electively on May 31, 2005.   HOSPITAL COURSE:  The patient presented May 31, 2005 for a left heart  catheterization.  The study has been dictated above.  The study did show  that he had vein graft stenosis of the reverse saphenous vein graft to the  second obtuse marginal after left heart catheterization, he was immediately  given interventional therapy with PCI placement of a drug-eluting stent to  the distal saphenous vein graft to obtuse marginal, reducing a 75% stenosis  to 0.  He tolerated the procedure well and on the following day June 01, 2005 Dr. Lewayne Bunting explanted his existing St. Jude DDD pacemaker and  implanted DDD ICD without complications.  Defibrillator threshold study was  undertaken as dictated above.  The patient has had no post procedure  complications either from the catheterization site in the right groin where  there was mild ecchymoses, but no swelling, nor has had any problem with his  incision from the implantation.  He will go home with five days of  antibiotics and follow-up as dictated.      Debbora Lacrosse   GM/MEDQ  D:  06/02/2005  T:  06/02/2005  Job:  [id number]   cc:   Nolon Nations  [phone] [address]  [place]  [place]  [address]  Fax: [phone]

## 2011-04-29 NOTE — Op Note (Signed)
NAMEJERIMAH, [name] Rich                 ACCOUNT NO.:  [id number]   MEDICAL RECORD NO.:  [id number]          PATIENT TYPE:  OIB   LOCATION:  6522                         FACILITY:  [facility]   PHYSICIAN:  Doylene Canning. Ladona Ridgel, M.D.  DATE OF BIRTH:  1932/07/05   DATE OF PROCEDURE:  06/01/2005  DATE OF DISCHARGE:                                 OPERATIVE REPORT   PROCEDURE PERFORMED:  Implantation of a dual-chamber ICD with removal of a  previously implanted dual-chamber pacemaker generator.   INTRODUCTION:  The patient is a 73-year male with a history of ischemic  cardiomyopathy and severe LV dysfunction with an ejection fraction by  Cardiolite, as well as by catheterization, of 30%. He has longstanding  complete heart block, status post pacemaker insertion. His pacemaker is at  the ERI, almost at EOL. He is no ventricular escape. He has class II heart  failure. He has a large anterior scar. He is now referred for upgrade to a  dual-chamber ICD.   DESCRIPTION OF PROCEDURE:  After informed consent was obtained, the patient  taken to the diagnostic EP laboratory in the fasting state. After usual  preparation and draping, intravenous fentanyl, Valium and Versed were used  for sedation. A total of 30 mL of lidocaine was infiltrated over the old  implanted pacemaker insertion site. A 9 cm incision was carried out over  this region. Electrocautery utilized to dissect down to the fascial plane.  Then 10 mL of contrast was injected into the left upper extremity venous  system. This demonstrated a patent left subclavian vein. It was subsequently  punctured and the St. Jude model [address] 65 cm active fixation defibrillation  lead, serial number [id number], was advanced into the right ventricle. The  lead was placed above the previously implanted pacemaker lead on the RV  septum. The R waves (paced) were 7 mV. The pacing impedance from the newly  implanted lead with the lead actively fixed was 587 ohms. The  pacing  threshold was 0.9 volts at 0.5 msec. The 10-volt pacing did not stimulate  the diaphragm. With the defibrillation lead in satisfactory position, it was  secured to the subpectoralis fascia with a figure-of-eight silk suture. The  sewing sleeve was also secured skin silk suture. At this point,  electrocautery was utilized to enter the subcutaneous pocket and remove the  previously implanted St. Jude pacemaker without difficulty. The atrial lead  was evaluated and R waves were found to be 2 mV and the pacing impedance was  395 ohms. The previously implanted RV pacing lead was capped. After removal  of the pacemaker generator, the pocket was expanded slightly with  electrocautery. Additional kanamycin was utilized to irrigate the pocket. At  this point, the [facility]. Jude Atlas Plus DR model V-243, serial number [id number],  dual chamber defibrillator was connected to the defibrillation lead and the  previously implanted atrial lead and placed back in the subcutaneous pocket.  Additional kanamycin was utilized to irrigate the pocket. Defibrillation  threshold testing was carried out.   After the patient was more deeply sedated with fentanyl  and Valium, VF was  induced with T-wave shock. A 15-joule shock was subsequently delivered,  which failed to terminate VF. A second 25-joule shock was delivered, which  terminated ventricular fibrillation and restored sinus rhythm. Five minutes  was allowed to elapse and second DFT test carried out. Again VF was induced  with a T-wave shock and again a 25-joule shock was delivered, which  terminated the ventricular fibrillation and restored sinus rhythm. At this  point, no additional defibrillation threshold testing was carried out. The  incision was closed with a layer of 2-0 Vicryl followed by a layer of 3-0  Vicryl followed by a layer of 4-0 Vicryl. Benzoin was painted on the skin  suture, Steri-Strips were applied and a pressure dressing was placed.  The  patient was returned to his room in satisfactory condition.   COMPLICATIONS:  There were no immediate procedure complications.   RESULTS:  This demonstrates successful removal of a previously implanted St.  Jude pacemaker and successful insertion of a new St. Jude dual-chamber  defibrillator in a patient with an ischemic cardiomyopathy, complete heart  block, severe LV dysfunction and class II heart failure.       GWT/MEDQ  D:  06/01/2005  T:  06/01/2005  Job:  [id number]   cc:   Duke Salvia, M.D.   [facility] [employer]

## 2011-04-29 NOTE — Letter (Signed)
July 26, 2006     Tillman Abide, M.D.  [address] [place], [place] [address]   RE:  [name] Rich, [name] Rich  MRN:  [id number]  /  DOB:  1932/02/02   Dear Gerlene Burdock:   [name] Rich came in today for his device follow-up.  He told me the story of  his Zocor which is very troubling.  I am glad that you were able to have  enough perspective to get him off of that drug and he is much better.   He is otherwise doing pretty well.   On examination his blood pressure is 122/62, pulse is 64.  Lungs were clear.  Heart sounds were regular and extremities were without edema.   Interrogation of his CQ ICD demonstrates a P-wave of 2.7, impedence of 435,  threshold 1 volt at 0.5.  There is no intrinsic ventricular rhythm and he is  dependent.  His impedence was 500 and threshold was 0.75 at 0.5.   IMPRESSION:  1. Ischemic heart disease.      a.     Status post bypass grafting.      b.     Status post stenting.  2. Complete heart block, status post pacer with left grade to an ICD in      June of 2006 because of LV dysfunction.  3. Abdominal aortic aneurysm.  4. Abnormal chest CT scan.   [name] Rich needs follow-up of his chest nodule that was identified by CT scan  a year ago as well as his abdominal aortic aneurysm, also clarified a year  ago.  We will arrange these.  I will forward results of the chest scan to  you.   Hope this letter finds you well.   Lenon Oms, MD, [facility]   SCK/MedQ  DD:  07/26/2006  DT:  07/26/2006  Job #:  [id number]

## 2011-04-29 NOTE — Cardiovascular Report (Signed)
NAMECARNELIUS, HAMMITT                 ACCOUNT NO.:  [id number]   MEDICAL RECORD NO.:  [id number]          PATIENT TYPE:  OIB   LOCATION:  1961                         FACILITY:  [facility]   PHYSICIAN:  Peter C. Eden Emms, MD, FACCDATE OF BIRTH:  1932-11-11   DATE OF PROCEDURE:  DATE OF DISCHARGE:                            CARDIAC CATHETERIZATION   INDICATIONS:  Status post coronary bypass grafting with abnormal Myoview  and pain in the left arm with exertion.   Cine catheterization is done with 5-French catheters from right femoral  artery.   Left main coronary artery was normal.   Left anterior descending artery was 100% occluded in the midvessel.   The circumflex coronary artery was large and less dominant.  Both obtuse  marginal branches were 100% occluded.  There was 30% tubular disease in  the distal PDA and posterolateral branches.  There was what appeared to  be a ramus branch with 30% tubular disease.   The right coronary artery native was small and nondominant with 20-30%  multiple discrete lesions.   The left internal mammary artery was widely patent to the LAD.   The saphenous vein graft to the obtuse marginal branch was widely  patent.  There was a stent in the mid body of the graft which appeared  widely patent.   Saphenous vein graft to the diagonal branch was widely patent.  The  diagonal branch itself was somewhat small.   RAO ventriculography.  RAO ventriculography showed apical wall  hypokinesis with an EF of 45-50%.  There is no significant gradient  across the aortic valve and no MR.   We had some difficulty passing a wire from the aortoiliac bifurcation.  At the end of the case we did a distal aortogram.  The patient had a  small saccular aneurysm at the bifurcation of the distal aorta into the  iliacs.   This can be followed by ultrasound.   IMPRESSION:  The patient is well revascularized with patent grafts.   He would not appear to have any source of  ischemia.  Small saccular  abdominal aortic aneurysm at the distal aorta iliac bifurcation site.  Follow-up ultrasound indicated.   The patient tolerated the procedure well.      Noralyn Pick. Eden Emms, MD, [facility]  Electronically Signed     PCN/MEDQ  D:  12/14/2006  T:  12/14/2006  Job:  [id number]   cc:   Duke Salvia, MD, [facility]

## 2011-04-29 NOTE — H&P (Signed)
[name], Rich NO.:  [id number]   MEDICAL RECORD NO.:  [id number]          PATIENT TYPE:  INP   LOCATION:  6522                         FACILITY:  [facility]   PHYSICIAN:  Doylene Canning. Ladona Ridgel, M.D.  DATE OF BIRTH:  [birth year]   DATE OF ADMISSION:  05/31/2005  DATE OF DISCHARGE:  06/02/2005                                HISTORY & PHYSICAL   ELECTRO PHYSIOLOGIST:  Doylene Canning. Ladona Ridgel, M.D.   PRIMARY CARE PHYSICIAN:  Nolon Nations, M.D.   ALLERGIES:  No known drug allergies.   PRESENTING CIRCUMSTANCE:  I'm here to get a heart catheterization.   HISTORY OF PRESENT ILLNESS:  [name] is a 77 year old male followed  by Dr. Duke Salvia. He has a St. Jude pacemaker implanted for complete  heart block. He has a history of myocardial infarction and three-vessel  coronary artery disease. He is status post coronary artery bypass graft  surgery in 1997. He has been complaining of chest discomfort and increasing  chest tightness with exertion. He underwent an Adenosine-Cardiolite on May 25, 2005 and this study was abnormal and the patient was scheduled for an  elective left heart catheterization. The patient has no prior history of  syncope. He will admitted electively on May 31, 2005.   MEDICATIONS:  1.  Nadolol 20 mg daily.  2.  Enteric-coated aspirin 325 mg daily.  3.  Plavix 75 mg daily.  4.  Multivitamin daily.  5.  Zocor 20 mg 1/2 tablet every other day.  6.  Pepcid 10 mg daily.   PAST MEDICAL HISTORY:  1.  Myocardial infarction.  2.  Three-vessel coronary artery disease, status post coronary artery bypass      graft surgery in 1997.  3.  Ischemic cardiomyopathy.  4.  Class III congestive heart failure.  5.  History of syncope.   SOCIAL HISTORY:  The patient lives with his wife. He does not smoke or  partake of alcohol beverages. Primary care giver is Dr. Verda Cumins.   FAMILY HISTORY:  Noncontributory.   REVIEW OF SYSTEMS:  GENERAL:  The  patient is not having any fevers, chills,  night sweats. No weight loss or weight gain. No adenopathy. HEENT:  No  hoarseness, no vertigo, and no epistaxis. INTEGUMENT:  Nonhealing  ulcerations. CARDIOPULMONARY:  Increasing chest discomfort especially with  exertion. Also progressive dyspnea with chest discomfort. No paroxysm  nocturnal dyspnea. Two pillow orthopnea. No evidence of extreme lower  extremity claudication. No syncope or presyncope. No palpitation.  GENITOURINARY: Nocturia one to two times a night. No frequency or urgency.  GI:  No nausea, vomiting, diarrhea. No history of GI bleeding. No  gastroesophageal reflux disease. NEUROLOGICAL:  Psychiatric shows no  numbness. No anxiety or depression. MUSCULOSKELETAL:  No disabling  arthralgias or effusions. Other systems all negative.   PHYSICAL EXAMINATION:  GENERAL:  The patient is alert and oriented x3 in no  acute distress.  VITAL SIGNS:  Temperature 97.9, pulse is 84, respirations 18, blood pressure  116/85.  LUNGS:  Clear to auscultation and percussion bilaterally.  HEART:  Slow, this is a paced rhythm. The patient in complete heart block.  ABDOMEN:  Soft with a mildly palpable abdominal impulse shown by abdominal  ultrasound to be a wide gauged aorta without focal dilatation or aneurysm.  Bowel sounds are present throughout. No hepatosplenomegaly and no guarding.  NEUROLOGICAL:  No neurologic deficits located.   IMPRESSION:  1.  Presentation for elective heart catheterization in the setting of an      abnormal Cardiolite study and clinical findings of chest pain,      especially with exertion.  2.  Three-vessel coronary artery disease, status post coronary artery bypass      graft surgery in 1997.  3.  Status post implantation of St. Jude permanent pacemaker for complete      heart block.  4.  History of syncope.   PLAN:  Admit for left heart catheterization. If negative for ischemia but  decreased ejection fraction,  the patient will have cardioverter  defibrillator implanted, dual-chamber. If negative for ischemia but ejection  fraction is normal, then the pacemaker generator will be changed per  electrophysiology.      [name], P.A.    ______________________________  Doylene Canning. Ladona Ridgel, M.D.    GM/MEDQ  D:  08/24/2005  T:  08/25/2005  Job:  [id number]   cc:   Doylene Canning. Ladona Ridgel, M.D.  [address]  [address]  [place]  [place] [address]   Nolon Nations  [phone]  [place]  [place] [address]  Fax: [phone]

## 2011-05-18 ENCOUNTER — Telehealth: Payer: Self-pay

## 2011-05-18 MED ORDER — CLOPIDOGREL BISULFATE 75 MG PO TABS: 75.0000 mg | ORAL_TABLET | Freq: Every day | ORAL | Status: DC

## 2011-05-18 NOTE — Telephone Encounter (Signed)
Please send new Rx for plavix 75 mg take one tablet daily with 90 day supply and 3 refills.

## 2011-05-19 ENCOUNTER — Other Ambulatory Visit: Payer: Self-pay | Admitting: Emergency Medicine

## 2011-05-19 MED ORDER — CLOPIDOGREL BISULFATE 75 MG PO TABS: 75.0000 mg | ORAL_TABLET | Freq: Every day | ORAL | Status: DC

## 2011-05-20 ENCOUNTER — Telehealth: Payer: Self-pay | Admitting: Internal Medicine

## 2011-05-20 MED ORDER — CLOPIDOGREL BISULFATE 75 MG PO TABS: 75.0000 mg | ORAL_TABLET | Freq: Every day | ORAL | Status: DC

## 2011-05-20 NOTE — Telephone Encounter (Signed)
Refill plavix 75 mg uses [facility] order

## 2011-05-30 ENCOUNTER — Encounter: Payer: Self-pay | Admitting: Cardiovascular Disease

## 2011-07-06 ENCOUNTER — Ambulatory Visit (INDEPENDENT_AMBULATORY_CARE_PROVIDER_SITE_OTHER): Payer: Medicare Other | Admitting: *Deleted

## 2011-07-06 DIAGNOSIS — Z95810 Presence of automatic (implantable) cardiac defibrillator: Secondary | ICD-10-CM

## 2011-07-06 DIAGNOSIS — I442 Atrioventricular block, complete: Secondary | ICD-10-CM

## 2011-07-06 DIAGNOSIS — I509 Heart failure, unspecified: Secondary | ICD-10-CM

## 2011-07-06 DIAGNOSIS — I2589 Other forms of chronic ischemic heart disease: Secondary | ICD-10-CM

## 2011-07-06 LAB — ICD DEVICE OBSERVATION
ATRIAL PACING ICD: 46 pct
BAMS-0001: 170 {beats}/min
BAMS-0003: 70 {beats}/min
BATTERY VOLTAGE: 2.54 V
DEVICE MODEL ICD: 272853
RV LEAD IMPEDENCE ICD: 550 Ohm
TOT-0009: 2
TOT-0010: 47
TZAT-0004SLOWVT: 8
TZAT-0012SLOWVT: 200 ms
TZAT-0013SLOWVT: 4
TZON-0005SLOWVT: 6
TZST-0001SLOWVT: 3
TZST-0001SLOWVT: 5
TZST-0003SLOWVT: 36 J
TZST-0003SLOWVT: 36 J

## 2011-07-06 NOTE — Progress Notes (Signed)
ICD interrogation only for battery check/[place]

## 2011-07-14 ENCOUNTER — Encounter: Payer: Self-pay | Admitting: Internal Medicine

## 2011-08-02 ENCOUNTER — Encounter: Payer: Self-pay | Admitting: Internal Medicine

## 2011-08-02 ENCOUNTER — Ambulatory Visit (INDEPENDENT_AMBULATORY_CARE_PROVIDER_SITE_OTHER): Payer: Medicare Other | Admitting: Internal Medicine

## 2011-08-02 DIAGNOSIS — I509 Heart failure, unspecified: Secondary | ICD-10-CM

## 2011-08-02 DIAGNOSIS — I442 Atrioventricular block, complete: Secondary | ICD-10-CM

## 2011-08-02 DIAGNOSIS — I2510 Atherosclerotic heart disease of native coronary artery without angina pectoris: Secondary | ICD-10-CM

## 2011-08-02 DIAGNOSIS — Z95810 Presence of automatic (implantable) cardiac defibrillator: Secondary | ICD-10-CM

## 2011-08-02 LAB — ICD DEVICE OBSERVATION
AL THRESHOLD: 1 V
BATTERY VOLTAGE: 2.53 V
CHARGE TIME: 0 s
RV LEAD IMPEDENCE ICD: 540 Ohm
RV LEAD THRESHOLD: 1 V
TOT-0006: 20120725000000
TOT-0008: 0
TOT-0009: 2
TOT-0010: 47
TZAT-0013SLOWVT: 4
TZAT-0018SLOWVT: NEGATIVE
TZAT-0019SLOWVT: 7.5 V
TZAT-0020SLOWVT: 1 ms
TZON-0003SLOWVT: 365 ms
TZON-0005SLOWVT: 6
TZON-0008SLOWVT: 50 ms
TZON-0010SLOWVT: 80 ms
TZST-0001SLOWVT: 2
TZST-0001SLOWVT: 4
TZST-0003SLOWVT: 36 J
TZST-0003SLOWVT: 36 J

## 2011-08-02 NOTE — Assessment & Plan Note (Signed)
Stable

## 2011-08-02 NOTE — Assessment & Plan Note (Signed)
The patient's device was interrogated.  The information was reviewed. No changes were made in the programming.    

## 2011-08-02 NOTE — Progress Notes (Signed)
  HPI  [name] Rich is a 77 y.o. male Seen in followup for complete heart block in the setting of ischemic myopathy. He is status post ICD implantation and he is approaching ERI.  Since his last visit 2 months ago he has been relatively stable. He denies any significant chest pains or shortness of breath. He is not playing golf.  Past Medical History  Diagnosis Date  . CHF (congestive heart failure)   . Coronary artery disease     s/p CABG  . Ischemic cardiomyopathy   . Colon, diverticulosis   . GERD (gastroesophageal reflux disease)   . BPH (benign prostatic hyperplasia)     Dr. Achilles Dunk  . Heart block     pacer/defib 6/06-----Dr. Graciela Husbands  . AAA (abdominal aortic aneurysm)   . Hyperlipidemia   . Colon cancer 1993  . Cognitive impairment     mild    Past Surgical History  Procedure Date  . Coronary artery bypass graft 1997  . Colon resection     R COLON - Cecal cancer  . Cholecystectomy   . Pacemaker insertion 02/1996  . Prostate biopsy 03/2003    negative  . Icd 05/2005    (TYLER) St Jude Atlas Plus  . Cardiac catheterization   . Insert / replace / remove pacemaker 2006    pacer/defib  (St Jude)    Current Outpatient Prescriptions  Medication Sig Dispense Refill  . aspirin 81 MG tablet Take 81 mg by mouth daily.        . clopidogrel (PLAVIX) 75 MG tablet Take 75 mg by mouth daily.        Marland Kitchen dutasteride (AVODART) 0.5 MG capsule Take 0.5 mg by mouth daily.        . Multiple Vitamins-Minerals (CENTRUM SILVER PO) Take 1 tablet by mouth daily.        . nadolol (CORGARD) 20 MG tablet Take 20 mg by mouth daily.          Allergies  Allergen Reactions  . Atorvastatin     REACTION: confusion  . Simvastatin     REACTION: zombie    Review of Systems negative except from HPI and PMH  Physical Exam Well developed and well nourished in no acute distress HENT normal E scleral and icterus clear Neck Supple JVP flat; carotids brisk and full Clear to ausculation Regular rate  and rhythm, a 2/6 murmur along the sternal border Soft with active bowel sounds No clubbing cyanosis and edema Alert and oriented, grossly normal motor and sensory function Skin Warm and Dry some ecchymoses    Assessment and  Plan

## 2011-08-02 NOTE — Patient Instructions (Signed)
Your physician recommends that you schedule a follow-up appointment in: 1 month with [facility] for device check

## 2011-08-03 ENCOUNTER — Encounter: Payer: Self-pay | Admitting: Internal Medicine

## 2011-08-03 ENCOUNTER — Ambulatory Visit (INDEPENDENT_AMBULATORY_CARE_PROVIDER_SITE_OTHER): Payer: Medicare Other | Admitting: Internal Medicine

## 2011-08-03 VITALS — BP 118/59 | HR 61 | Temp 98.4°F | Ht 71.0 in | Wt 139.0 lb

## 2011-08-03 DIAGNOSIS — G3184 Mild cognitive impairment, so stated: Secondary | ICD-10-CM

## 2011-08-03 DIAGNOSIS — I10 Essential (primary) hypertension: Secondary | ICD-10-CM

## 2011-08-03 DIAGNOSIS — N400 Enlarged prostate without lower urinary tract symptoms: Secondary | ICD-10-CM

## 2011-08-03 MED ORDER — DONEPEZIL HCL 5 MG PO TABS: 5.0000 mg | ORAL_TABLET | Freq: Every day | ORAL | Status: DC

## 2011-08-03 NOTE — Assessment & Plan Note (Signed)
BP Readings from Last 3 Encounters:  08/03/11 118/59  08/02/11 131/76  04/15/11 130/62   Good control No changes needed Lab Results  Component Value Date   CREATININE 0.9 01/31/2011

## 2011-08-03 NOTE — Assessment & Plan Note (Signed)
Clear progression per wife Still mild cognitive impairment on functional basis Will try donepezil trial

## 2011-08-03 NOTE — Progress Notes (Signed)
Subjective:    Patient ID: [name] Rich, male    DOB: 1932-02-20, 77 y.o.   MRN: [id number]  HPI DOing okay Wife is here Has had regular follow up with Dr Graciela Husbands  Memory is about the same--he feels Wife feels "we have lost some ground this summer" Mild confusion as well---trouble understanding things (like he asked me to slow down to try to catch what I was saying) Still independent with all ADLs Still does the bills but wife checks Drives short distances Hasn't been playing golf  No chest pain No SOB Does walk regularly No edema No palpitations  No trouble voiding Nocturia x 1-2 No sig daytime problems  Current Outpatient Prescriptions on File Prior to Visit  Medication Sig Dispense Refill  . aspirin 81 MG tablet Take 81 mg by mouth daily.        . clopidogrel (PLAVIX) 75 MG tablet Take 1 tablet (75 mg total) by mouth daily.  90 tablet  3  . dutasteride (AVODART) 0.5 MG capsule Take 0.5 mg by mouth daily.        . Multiple Vitamins-Minerals (CENTRUM SILVER PO) Take 1 tablet by mouth daily.        . nadolol (CORGARD) 20 MG tablet Take 20 mg by mouth daily.          Allergies  Allergen Reactions  . Atorvastatin     REACTION: confusion  . Simvastatin     REACTION: zombie    Past Medical History  Diagnosis Date  . CHF (congestive heart failure)   . Coronary artery disease     s/p CABG  . Ischemic cardiomyopathy   . Colon, diverticulosis   . GERD (gastroesophageal reflux disease)   . BPH (benign prostatic hyperplasia)     Dr. Achilles Dunk  . Heart block     pacer/defib 6/06-----Dr. Graciela Husbands  . AAA (abdominal aortic aneurysm)   . Hyperlipidemia   . Colon cancer 1993  . Cognitive impairment     mild  . Hyperplastic colon polyp     Recurrent    Past Surgical History  Procedure Date  . Coronary artery bypass graft 1997  . Colon resection 1993    R COLON - Cecal cancer  . Cholecystectomy   . Pacemaker insertion 02/1996  . Prostate biopsy 03/2003    negative  .  Icd 05/2005    (TYLER) St Jude Atlas Plus  . Cardiac catheterization   . Insert / replace / remove pacemaker 2006    pacer/defib  (St Jude)  . Cardiolite  12/1999    negative  . Adenosine stress  05/2005    Fixed AMT defect, LV 30% (Dilated)  . Adenosine myoview 11/2006    Low risk  . Coronary angioplasty 12/2006    No significant problems  . Recurrent hyperplastic polyps     No family history on file.  History   Social History  . Marital Status: Married    Spouse Name: N/A    Number of Children: 3  . Years of Education: N/A   Occupational History  . retired [employer]   . Previously [employer]    Social History Main Topics  . Smoking status: Former Smoker -- 1.0 packs/day    Types: Cigarettes    Quit date: 03/13/1996  . Smokeless tobacco: Never Used  . Alcohol Use: No  . Drug Use: No  . Sexually Active: Not on file   Other Topics Concern  . Not on file   Social  History Narrative   Married with 2 sons, 1 daughter   Review of Systems Sleeps well--- 8-10 hours per night Appetite is good Weight stable    Objective:   Physical Exam  Constitutional: He appears well-developed and well-nourished. No distress.  Neck: Normal range of motion. Neck supple. No thyromegaly present.  Cardiovascular: Normal rate, regular rhythm and normal heart sounds.  Exam reveals no gallop.   No murmur heard. Pulmonary/Chest: Effort normal and breath sounds normal. No respiratory distress. He has no wheezes. He has no rales.  Musculoskeletal: Normal range of motion. He exhibits no edema and no tenderness.  Lymphadenopathy:    He has no cervical adenopathy.  Neurological:       Oriented to year only --not day or month [employer] "that black guy"  Psychiatric: He has a normal mood and affect. His behavior is normal.       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

## 2011-08-03 NOTE — Assessment & Plan Note (Signed)
Voiding okay Still follows with Dr Achilles Dunk

## 2011-08-03 NOTE — Patient Instructions (Signed)
Please call if there are any serious problems with the new med, donepezil.

## 2011-08-08 ENCOUNTER — Telehealth: Payer: Self-pay

## 2011-08-08 NOTE — Telephone Encounter (Signed)
Dr. Alphonsus Sias told patient need to take aricept.  The patient's wife looked at the side effects of aricept and saw that if a heart patient should not take aricept. The patient's wife would like to know if okay to take aricept since he has a low rate & decrease blood pressure with a defib.  Also can patient get the shingles vaccine?  Please call ASAP! [phone].

## 2011-08-09 ENCOUNTER — Telehealth: Payer: Self-pay | Admitting: *Deleted

## 2011-08-09 MED ORDER — ZOSTER VACCINE LIVE 19400 UNT/0.65ML ~~LOC~~ SOLR: 0.6500 mL | Freq: Once | SUBCUTANEOUS | Status: DC

## 2011-08-09 NOTE — Telephone Encounter (Signed)
Spoke with patient's wife and advised results  

## 2011-08-09 NOTE — Telephone Encounter (Signed)
Patient's wife would like for you to review his records and make sure that it is okay for him to take a shingles vaccine. Please forward rx to [facility] if it is okay for him to take the vaccine. Please let patient know when script has been sent.

## 2011-08-09 NOTE — Telephone Encounter (Signed)
It is fine for him to take the zostavax Please send Rx for him

## 2011-08-10 NOTE — Telephone Encounter (Signed)
i dont know about the vaccine issue; but i would take the aricept from the cardiac point of view as he has a pacemaker and we dont have to worry about the slow HR Thanks steve

## 2011-08-12 NOTE — Telephone Encounter (Signed)
LMOM okay to take aricept.

## 2011-08-26 ENCOUNTER — Telehealth: Payer: Self-pay | Admitting: *Deleted

## 2011-08-26 DIAGNOSIS — T82198A Other mechanical complication of other cardiac electronic device, initial encounter: Secondary | ICD-10-CM

## 2011-08-26 NOTE — Telephone Encounter (Signed)
Checking lead 

## 2011-08-30 ENCOUNTER — Telehealth: Payer: Self-pay | Admitting: *Deleted

## 2011-08-30 NOTE — Telephone Encounter (Signed)
Form done 

## 2011-08-30 NOTE — Telephone Encounter (Signed)
Form faxed and scanned.

## 2011-08-30 NOTE — Telephone Encounter (Signed)
Prior auth is needed for donepezil, form is on your desk. 

## 2011-08-30 NOTE — Telephone Encounter (Signed)
Prior auth given for donepezil, advised pharmacy.  Approval letter placed on doctor's desk for signature and scanning. 

## 2011-09-02 ENCOUNTER — Other Ambulatory Visit: Payer: Self-pay | Admitting: Internal Medicine

## 2011-09-09 ENCOUNTER — Ambulatory Visit (INDEPENDENT_AMBULATORY_CARE_PROVIDER_SITE_OTHER): Payer: Medicare Other | Admitting: *Deleted

## 2011-09-09 ENCOUNTER — Encounter: Payer: Self-pay | Admitting: Internal Medicine

## 2011-09-09 DIAGNOSIS — I442 Atrioventricular block, complete: Secondary | ICD-10-CM

## 2011-09-09 DIAGNOSIS — I428 Other cardiomyopathies: Secondary | ICD-10-CM

## 2011-09-09 LAB — ICD DEVICE OBSERVATION
BATTERY VOLTAGE: 2.51 V
CHARGE TIME: 12.6 s
DEV-0020ICD: NEGATIVE
DEVICE MODEL ICD: 272853
MODE SWITCH EPISODES: 333
TOT-0006: 20120821000000
TOT-0007: 2
TOT-0010: 48
TZAT-0004SLOWVT: 8
TZAT-0013SLOWVT: 4
TZAT-0018SLOWVT: NEGATIVE
TZAT-0019SLOWVT: 7.5 V
TZON-0003SLOWVT: 365 ms
TZON-0004SLOWVT: 12
TZON-0008SLOWVT: 50 ms
TZON-0010SLOWVT: 80 ms
TZST-0001SLOWVT: 2
TZST-0001SLOWVT: 3
TZST-0001SLOWVT: 5
TZST-0003SLOWVT: 36 J
TZST-0003SLOWVT: 36 J
VENTRICULAR PACING ICD: 99 pct

## 2011-09-09 NOTE — Progress Notes (Signed)
ICD battery check only 

## 2011-09-19 ENCOUNTER — Other Ambulatory Visit: Payer: Self-pay | Admitting: Cardiology

## 2011-09-19 DIAGNOSIS — I714 Abdominal aortic aneurysm, without rupture: Secondary | ICD-10-CM

## 2011-09-20 ENCOUNTER — Encounter (INDEPENDENT_AMBULATORY_CARE_PROVIDER_SITE_OTHER): Payer: Medicare Other | Admitting: *Deleted

## 2011-09-20 DIAGNOSIS — I714 Abdominal aortic aneurysm, without rupture: Secondary | ICD-10-CM

## 2011-10-11 ENCOUNTER — Ambulatory Visit: Payer: Medicare Other | Admitting: Internal Medicine

## 2011-10-14 ENCOUNTER — Encounter: Payer: Self-pay | Admitting: Internal Medicine

## 2011-10-14 ENCOUNTER — Ambulatory Visit (INDEPENDENT_AMBULATORY_CARE_PROVIDER_SITE_OTHER): Payer: Medicare Other | Admitting: *Deleted

## 2011-10-14 DIAGNOSIS — I509 Heart failure, unspecified: Secondary | ICD-10-CM

## 2011-10-14 DIAGNOSIS — I442 Atrioventricular block, complete: Secondary | ICD-10-CM

## 2011-10-14 LAB — ICD DEVICE OBSERVATION
ATRIAL PACING ICD: 47 pct
BAMS-0001: 170 {beats}/min
BATTERY VOLTAGE: 2.5 V
BRDY-0002RA: 60 {beats}/min
BRDY-0003RA: 120 {beats}/min
CHARGE TIME: 12.6 s
DEV-0020ICD: NEGATIVE
MODE SWITCH EPISODES: 1017
RV LEAD IMPEDENCE ICD: 570 Ohm
TOT-0006: 20120821000000
TOT-0009: 2
TOT-0010: 48
TZAT-0004SLOWVT: 8
TZAT-0012SLOWVT: 200 ms
TZAT-0013SLOWVT: 4
TZAT-0018SLOWVT: NEGATIVE
TZAT-0019SLOWVT: 7.5 V
TZON-0003SLOWVT: 365 ms
TZON-0004SLOWVT: 12
TZST-0001SLOWVT: 3
TZST-0001SLOWVT: 5
TZST-0003SLOWVT: 36 J
TZST-0003SLOWVT: 36 J
TZST-0003SLOWVT: 36 J

## 2011-10-14 NOTE — Progress Notes (Signed)
ICD battery check only 

## 2011-11-15 ENCOUNTER — Encounter: Payer: Self-pay | Admitting: Internal Medicine

## 2011-11-16 ENCOUNTER — Ambulatory Visit (INDEPENDENT_AMBULATORY_CARE_PROVIDER_SITE_OTHER): Payer: Medicare Other | Admitting: *Deleted

## 2011-11-16 DIAGNOSIS — I442 Atrioventricular block, complete: Secondary | ICD-10-CM

## 2011-11-16 DIAGNOSIS — I509 Heart failure, unspecified: Secondary | ICD-10-CM

## 2011-11-16 LAB — ICD DEVICE OBSERVATION
AL AMPLITUDE: 1 mv
AL IMPEDENCE ICD: 240 Ohm
BAMS-0001: 170 {beats}/min
BAMS-0003: 70 {beats}/min
BRDY-0003RA: 120 {beats}/min
CHARGE TIME: 13.1 s
MODE SWITCH EPISODES: 2021
RV LEAD IMPEDENCE ICD: 550 Ohm
TOT-0007: 2
TOT-0008: 0
TOT-0010: 49
TZAT-0001SLOWVT: 1
TZAT-0004SLOWVT: 8
TZAT-0012SLOWVT: 200 ms
TZAT-0019SLOWVT: 7.5 V
TZAT-0020SLOWVT: 1 ms
TZST-0001SLOWVT: 2
TZST-0001SLOWVT: 4
TZST-0001SLOWVT: 5
TZST-0003SLOWVT: 36 J
TZST-0003SLOWVT: 36 J

## 2011-11-16 NOTE — Progress Notes (Signed)
ICD battery check only 

## 2011-11-28 ENCOUNTER — Ambulatory Visit: Payer: Medicare Other

## 2011-12-02 ENCOUNTER — Encounter: Payer: Self-pay | Admitting: Internal Medicine

## 2011-12-13 DIAGNOSIS — K469 Unspecified abdominal hernia without obstruction or gangrene: Secondary | ICD-10-CM

## 2011-12-13 HISTORY — DX: Unspecified abdominal hernia without obstruction or gangrene: K46.9

## 2011-12-19 ENCOUNTER — Encounter: Payer: Self-pay | Admitting: Internal Medicine

## 2011-12-19 ENCOUNTER — Ambulatory Visit (INDEPENDENT_AMBULATORY_CARE_PROVIDER_SITE_OTHER): Payer: Medicare Other | Admitting: Internal Medicine

## 2011-12-19 VITALS — BP 128/60 | HR 64 | Temp 97.9°F | Ht 71.0 in | Wt 143.0 lb

## 2011-12-19 DIAGNOSIS — R05 Cough: Secondary | ICD-10-CM

## 2011-12-19 DIAGNOSIS — N509 Disorder of male genital organs, unspecified: Secondary | ICD-10-CM

## 2011-12-19 DIAGNOSIS — R059 Cough, unspecified: Secondary | ICD-10-CM | POA: Insufficient documentation

## 2011-12-19 DIAGNOSIS — K4090 Unilateral inguinal hernia, without obstruction or gangrene, not specified as recurrent: Secondary | ICD-10-CM

## 2011-12-19 DIAGNOSIS — N50819 Testicular pain, unspecified: Secondary | ICD-10-CM

## 2011-12-19 LAB — POCT URINALYSIS DIPSTICK
Bilirubin, UA: NEGATIVE
Blood, UA: NEGATIVE
Glucose, UA: NEGATIVE
Leukocytes, UA: NEGATIVE
Nitrite, UA: NEGATIVE
Urobilinogen, UA: 0.2

## 2011-12-19 NOTE — Assessment & Plan Note (Signed)
Doesn't seem to be infectious Gas heat but house is dry claritin probably not helping Discussed trying humidifier

## 2011-12-19 NOTE — Assessment & Plan Note (Addendum)
Urinalysis benign  Reducible but symptomatic He wants to proceed with surgical eval and surgery Would recommend avoiding general anaesthesia if possible due to his cognitive problems Request [facility]

## 2011-12-19 NOTE — Patient Instructions (Signed)
Please set up the surgical appointment Try a humidifier at home while the heat is running

## 2011-12-19 NOTE — Progress Notes (Signed)
Subjective:    Patient ID: [name] Rich, male    DOB: 1932-01-05, 78 y.o.   MRN: [id number]  HPI Felt funny after taking 1 donepezil He refused to take anymore  Has left groin lump May be around for 2 months or so Has some aching but can't characterize when Hasn't been too active  Notes urinary frequency-but vague No dysuria No hematuria  Has cough again Not sick No sore throat Has been on the claritin still---recommended with seasonal problems  Current Outpatient Prescriptions on File Prior to Visit  Medication Sig Dispense Refill  . aspirin 81 MG tablet Take 81 mg by mouth daily.        . clopidogrel (PLAVIX) 75 MG tablet Take 1 tablet (75 mg total) by mouth daily.  90 tablet  3  . dutasteride (AVODART) 0.5 MG capsule Take 0.5 mg by mouth daily.        . Multiple Vitamins-Minerals (CENTRUM SILVER PO) Take 1 tablet by mouth daily.        . nadolol (CORGARD) 20 MG tablet TAKE ONE TABLET BY MOUTH EVERY DAY  30 tablet  6    Allergies  Allergen Reactions  . Atorvastatin     REACTION: confusion  . Simvastatin     REACTION: zombie    Past Medical History  Diagnosis Date  . CHF (congestive heart failure)   . Coronary artery disease     s/p CABG  . Ischemic cardiomyopathy   . Colon, diverticulosis   . GERD (gastroesophageal reflux disease)   . BPH (benign prostatic hyperplasia)     Dr. Achilles Dunk  . Heart block     pacer/defib 6/06-----Dr. Graciela Husbands  . AAA (abdominal aortic aneurysm)   . Hyperlipidemia   . Colon cancer 1993  . Cognitive impairment     mild  . Hyperplastic colon polyp     Recurrent    Past Surgical History  Procedure Date  . Coronary artery bypass graft 1997  . Colon resection 1993    R COLON - Cecal cancer  . Cholecystectomy   . Pacemaker insertion 02/1996  . Prostate biopsy 03/2003    negative  . Icd 05/2005    (TYLER) St Jude Atlas Plus  . Cardiac catheterization   . Insert / replace / remove pacemaker 2006    pacer/defib  (St Jude)  .  Cardiolite  12/1999    negative  . Adenosine stress  05/2005    Fixed AMT defect, LV 30% (Dilated)  . Adenosine myoview 11/2006    Low risk  . Coronary angioplasty 12/2006    No significant problems  . Recurrent hyperplastic polyps     No family history on file.  History   Social History  . Marital Status: Married    Spouse Name: N/A    Number of Children: 3  . Years of Education: N/A   Occupational History  . retired [employer]   . Previously [employer]    Social History Main Topics  . Smoking status: Former Smoker -- 1.0 packs/day    Types: Cigarettes    Quit date: 03/13/1996  . Smokeless tobacco: Never Used  . Alcohol Use: No  . Drug Use: No  . Sexually Active: Not on file   Other Topics Concern  . Not on file   Social History Narrative   Married with 2 sons, 1 daughter   Review of Systems Bowels have been okay Appetite is fine No nausea or vomiting    Objective:  Physical Exam  Constitutional: He appears well-developed and well-nourished. No distress.  HENT:  Mouth/Throat: Oropharynx is clear and moist. No oropharyngeal exudate.  Neck: Normal range of motion. Neck supple.  Pulmonary/Chest: Effort normal and breath sounds normal. No respiratory distress. He has no wheezes. He has no rales.  Abdominal: Soft. He exhibits mass. There is no tenderness.       Left inguinal hernia into scrotum slightly Reduces completely when supine  Lymphadenopathy:    He has no cervical adenopathy.          Assessment & Plan:

## 2011-12-22 ENCOUNTER — Encounter: Payer: Self-pay | Admitting: *Deleted

## 2011-12-22 ENCOUNTER — Other Ambulatory Visit: Payer: Self-pay | Admitting: Internal Medicine

## 2011-12-22 ENCOUNTER — Encounter: Payer: Self-pay | Admitting: Internal Medicine

## 2011-12-22 ENCOUNTER — Ambulatory Visit (INDEPENDENT_AMBULATORY_CARE_PROVIDER_SITE_OTHER): Payer: Medicare Other | Admitting: Internal Medicine

## 2011-12-22 DIAGNOSIS — I2510 Atherosclerotic heart disease of native coronary artery without angina pectoris: Secondary | ICD-10-CM

## 2011-12-22 DIAGNOSIS — Z95810 Presence of automatic (implantable) cardiac defibrillator: Secondary | ICD-10-CM

## 2011-12-22 DIAGNOSIS — I442 Atrioventricular block, complete: Secondary | ICD-10-CM

## 2011-12-22 DIAGNOSIS — Z01810 Encounter for preprocedural cardiovascular examination: Secondary | ICD-10-CM

## 2011-12-22 LAB — ICD DEVICE OBSERVATION
BAMS-0001: 170 {beats}/min
BAMS-0003: 70 {beats}/min
BATTERY VOLTAGE: 2.46 V
BRDY-0002RA: 60 {beats}/min
BRDY-0003RA: 120 {beats}/min
CHARGE TIME: 0 s
DEVICE MODEL ICD: 272853
MODE SWITCH EPISODES: 1524
RV LEAD IMPEDENCE ICD: 540 Ohm
TOT-0009: 2
TOT-0010: 49
TZAT-0004SLOWVT: 8
TZAT-0012SLOWVT: 200 ms
TZAT-0013SLOWVT: 4
TZAT-0018SLOWVT: NEGATIVE
TZON-0003SLOWVT: 365 ms
TZST-0001SLOWVT: 3
TZST-0001SLOWVT: 5
TZST-0003SLOWVT: 36 J
TZST-0003SLOWVT: 36 J
TZST-0003SLOWVT: 36 J

## 2011-12-22 NOTE — Assessment & Plan Note (Signed)
Stable on current medications 

## 2011-12-22 NOTE — Progress Notes (Signed)
  HPI  [name] Rich is a 78 y.o. male Seen in followup for complete heart block in the setting of ischemic myopathy. He is status post ICD implantation and he is approaching ERI.  Since his last visit 2 months ago he has been relatively stable. He denies any significant chest pains or shortness of breath. He is not playing golf.  Past Medical History  Diagnosis Date  . CHF (congestive heart failure)   . Coronary artery disease     s/p CABG  . Ischemic cardiomyopathy   . Colon, diverticulosis   . GERD (gastroesophageal reflux disease)   . BPH (benign prostatic hyperplasia)     Dr. Cope  . Heart block     pacer/defib 6/06-----Dr. Klein  . AAA (abdominal aortic aneurysm)   . Hyperlipidemia   . Colon cancer 1993  . Cognitive impairment     mild    Past Surgical History  Procedure Date  . Coronary artery bypass graft 1997  . Colon resection     R COLON - Cecal cancer  . Cholecystectomy   . Pacemaker insertion 02/1996  . Prostate biopsy 03/2003    negative  . Icd 05/2005    (TYLER) St Jude Atlas Plus  . Cardiac catheterization   . Insert / replace / remove pacemaker 2006    pacer/defib  (St Jude)    Current Outpatient Prescriptions  Medication Sig Dispense Refill  . aspirin 81 MG tablet Take 81 mg by mouth daily.        . clopidogrel (PLAVIX) 75 MG tablet Take 75 mg by mouth daily.        . dutasteride (AVODART) 0.5 MG capsule Take 0.5 mg by mouth daily.        . Multiple Vitamins-Minerals (CENTRUM SILVER PO) Take 1 tablet by mouth daily.        . nadolol (CORGARD) 20 MG tablet Take 20 mg by mouth daily.          Allergies  Allergen Reactions  . Atorvastatin     REACTION: confusion  . Simvastatin     REACTION: zombie    Review of Systems negative except from HPI and PMH  Physical Exam Well developed and well nourished in no acute distress HENT normal E scleral and icterus clear Neck Supple JVP flat; carotids brisk and full Clear to ausculation Regular rate  and rhythm, a 2/6 murmur along the sternal border Soft with active bowel sounds No clubbing cyanosis and edema Alert and oriented, grossly normal motor and sensory function Skin Warm and Dry some ecchymoses    Assessment and  Plan  

## 2011-12-22 NOTE — Assessment & Plan Note (Signed)
Has complete heart block and is device dependent. I had an extensive conversation with the patient's wife and the patient (will recall none of this according to his wife) regarding change out his defibrillator for defibrillator or for a pacemaker. It is her expressed desire that if the heart were to stop from an arrhythmia that she would prefer him not be resuscitated  To that end, we will plan to explant the defibrillator implanted pacemaker. We have reviewed benefits and risks including infection. They understand these risks and are willing to proceed.

## 2011-12-22 NOTE — Assessment & Plan Note (Signed)
The patient's device was interrogated.  The information was reviewed. No changes were made in the programming.   As above  

## 2011-12-23 ENCOUNTER — Encounter (HOSPITAL_COMMUNITY): Payer: Self-pay | Admitting: Pharmacy Technician

## 2011-12-23 LAB — BASIC METABOLIC PANEL
Calcium: 10 mg/dL (ref 8.6–10.2)
GFR calc non Af Amer: 74 mL/min/{1.73_m2} (ref >59)
Potassium: 4.6 mmol/L (ref 3.5–5.2)
Sodium: 135 mmol/L (ref 134–144)

## 2011-12-23 LAB — CBC WITH DIFFERENTIAL
Basos: 0 % (ref 0–3)
Eos: 3 % (ref 0–7)
Eosinophils Absolute: 0.2 10*3/uL (ref 0.0–0.4)
HCT: 45.1 % (ref 37.5–51.0)
Hemoglobin: 16 g/dL (ref 12.6–17.7)
Immature Grans (Abs): 0 10*3/uL (ref 0.0–0.1)
Lymphs: 22 % (ref 14–46)
Monocytes: 11 % (ref 4–13)
Neutrophils Absolute: 4.6 10*3/uL (ref 1.8–7.8)
Neutrophils Relative %: 64 % (ref 40–74)
RBC: 4.62 x10E6/uL (ref 4.14–5.80)
WBC: 7.2 10*3/uL (ref 4.0–10.5)

## 2011-12-23 LAB — PROTIME-INR
INR: 1 (ref 0.8–1.2)
Prothrombin Time: 10.5 s (ref 9.1–12.0)

## 2011-12-25 MED ORDER — CHLORHEXIDINE GLUCONATE 4 % EX LIQD: 60.0000 mL | Freq: Once | CUTANEOUS | Status: DC

## 2011-12-25 MED ORDER — SODIUM CHLORIDE 0.9 % IV SOLN: INTRAVENOUS | Status: DC

## 2011-12-25 MED ORDER — SODIUM CHLORIDE 0.9 % IR SOLN
80.0000 mg | Status: DC
  Filled 2011-12-25: qty 2

## 2011-12-25 MED ORDER — CEFAZOLIN SODIUM 1-5 GM-% IV SOLN: 1.0000 g | INTRAVENOUS | Status: DC

## 2011-12-26 ENCOUNTER — Ambulatory Visit (HOSPITAL_COMMUNITY)
Admission: RE | Admit: 2011-12-26 | Discharge: 2011-12-26 | Disposition: A | Discharge status: Home / Self Care | Payer: Medicare Other | Source: Ambulatory Visit | Attending: Internal Medicine | Admitting: Internal Medicine

## 2011-12-26 ENCOUNTER — Encounter (HOSPITAL_COMMUNITY)
Admission: RE | Disposition: A | Discharge status: Home / Self Care | Payer: Self-pay | Source: Ambulatory Visit | Attending: Internal Medicine

## 2011-12-26 DIAGNOSIS — Z01810 Encounter for preprocedural cardiovascular examination: Secondary | ICD-10-CM

## 2011-12-26 DIAGNOSIS — I442 Atrioventricular block, complete: Secondary | ICD-10-CM

## 2011-12-26 DIAGNOSIS — Z950 Presence of cardiac pacemaker: Secondary | ICD-10-CM | POA: Insufficient documentation

## 2011-12-26 DIAGNOSIS — I509 Heart failure, unspecified: Secondary | ICD-10-CM | POA: Insufficient documentation

## 2011-12-26 DIAGNOSIS — K219 Gastro-esophageal reflux disease without esophagitis: Secondary | ICD-10-CM | POA: Insufficient documentation

## 2011-12-26 DIAGNOSIS — Z95810 Presence of automatic (implantable) cardiac defibrillator: Secondary | ICD-10-CM

## 2011-12-26 DIAGNOSIS — I2510 Atherosclerotic heart disease of native coronary artery without angina pectoris: Secondary | ICD-10-CM

## 2011-12-26 DIAGNOSIS — Z4502 Encounter for adjustment and management of automatic implantable cardiac defibrillator: Secondary | ICD-10-CM | POA: Insufficient documentation

## 2011-12-26 HISTORY — PX: PERMANENT PACEMAKER INSERTION: SHX5480

## 2011-12-26 SURGERY — PERMANENT PACEMAKER INSERTION
Anesthesia: LOCAL

## 2011-12-26 MED ORDER — ONDANSETRON HCL 4 MG/2ML IJ SOLN: 4.0000 mg | Freq: Four times a day (QID) | INTRAMUSCULAR | Status: DC | PRN

## 2011-12-26 MED ORDER — SODIUM CHLORIDE 0.9 % IJ SOLN: 3.0000 mL | INTRAMUSCULAR | Status: DC | PRN

## 2011-12-26 MED ORDER — SODIUM CHLORIDE 0.9 % IV SOLN: 250.0000 mL | INTRAVENOUS | Status: DC | PRN

## 2011-12-26 MED ORDER — MUPIROCIN 2 % EX OINT
TOPICAL_OINTMENT | CUTANEOUS | Status: AC
  Filled 2011-12-26: qty 22

## 2011-12-26 MED ORDER — MUPIROCIN 2 % EX OINT
TOPICAL_OINTMENT | Freq: Two times a day (BID) | CUTANEOUS | Status: DC
  Administered 2011-12-26: 1 via NASAL

## 2011-12-26 MED ORDER — FENTANYL CITRATE 0.05 MG/ML IJ SOLN
INTRAMUSCULAR | Status: AC
  Filled 2011-12-26: qty 2

## 2011-12-26 MED ORDER — CEFAZOLIN SODIUM 1-5 GM-% IV SOLN
INTRAVENOUS | Status: AC
  Filled 2011-12-26: qty 50

## 2011-12-26 MED ORDER — SODIUM CHLORIDE 0.9 % IJ SOLN: 3.0000 mL | Freq: Two times a day (BID) | INTRAMUSCULAR | Status: DC

## 2011-12-26 MED ORDER — LIDOCAINE HCL (PF) 1 % IJ SOLN
INTRAMUSCULAR | Status: AC
  Filled 2011-12-26: qty 60

## 2011-12-26 MED ORDER — ACETAMINOPHEN 325 MG PO TABS: 325.0000 mg | ORAL_TABLET | ORAL | Status: DC | PRN

## 2011-12-26 MED ORDER — MIDAZOLAM HCL 5 MG/5ML IJ SOLN
INTRAMUSCULAR | Status: AC
  Filled 2011-12-26: qty 5

## 2011-12-26 MED ORDER — ACETAMINOPHEN 500 MG PO TABS: 1000.0000 mg | ORAL_TABLET | Freq: Four times a day (QID) | ORAL | Status: DC

## 2011-12-26 NOTE — H&P (View-Only) (Signed)
  HPI  [name] Rich is a 78 y.o. male Seen in followup for complete heart block in the setting of ischemic myopathy. He is status post ICD implantation and he is approaching ERI.  Since his last visit 2 months ago he has been relatively stable. He denies any significant chest pains or shortness of breath. He is not playing golf.  Past Medical History  Diagnosis Date  . CHF (congestive heart failure)   . Coronary artery disease     s/p CABG  . Ischemic cardiomyopathy   . Colon, diverticulosis   . GERD (gastroesophageal reflux disease)   . BPH (benign prostatic hyperplasia)     Dr. Cope  . Heart block     pacer/defib 6/06-----Dr. Klein  . AAA (abdominal aortic aneurysm)   . Hyperlipidemia   . Colon cancer 1993  . Cognitive impairment     mild    Past Surgical History  Procedure Date  . Coronary artery bypass graft 1997  . Colon resection     R COLON - Cecal cancer  . Cholecystectomy   . Pacemaker insertion 02/1996  . Prostate biopsy 03/2003    negative  . Icd 05/2005    (TYLER) St Jude Atlas Plus  . Cardiac catheterization   . Insert / replace / remove pacemaker 2006    pacer/defib  (St Jude)    Current Outpatient Prescriptions  Medication Sig Dispense Refill  . aspirin 81 MG tablet Take 81 mg by mouth daily.        . clopidogrel (PLAVIX) 75 MG tablet Take 75 mg by mouth daily.        . dutasteride (AVODART) 0.5 MG capsule Take 0.5 mg by mouth daily.        . Multiple Vitamins-Minerals (CENTRUM SILVER PO) Take 1 tablet by mouth daily.        . nadolol (CORGARD) 20 MG tablet Take 20 mg by mouth daily.          Allergies  Allergen Reactions  . Atorvastatin     REACTION: confusion  . Simvastatin     REACTION: zombie    Review of Systems negative except from HPI and PMH  Physical Exam Well developed and well nourished in no acute distress HENT normal E scleral and icterus clear Neck Supple JVP flat; carotids brisk and full Clear to ausculation Regular rate  and rhythm, a 2/6 murmur along the sternal border Soft with active bowel sounds No clubbing cyanosis and edema Alert and oriented, grossly normal motor and sensory function Skin Warm and Dry some ecchymoses    Assessment and  Plan  

## 2011-12-26 NOTE — Interval H&P Note (Signed)
History and Physical Interval Note:  12/26/2011 11:44 AM  [name] Rich  has presented today for surgery, with the diagnosis of Complete heart block  The various methods of treatment have been discussed with the patient and family. After consideration of risks, benefits and other options for treatment, the patient has consented to  Procedure(s): PERMANENT PACEMAKER INSERTION as a surgical intervention .  The patients' history has been reviewed, patient examined, no change in status, stable for surgery.  I have reviewed the patients' chart and labs.  Questions were answered to the patient's satisfaction.     Sherryl Manges

## 2011-12-26 NOTE — Op Note (Signed)
Preoperative diagnosis complete heart block, previous ICD at [facility] Postoperative diagnosis same/  Procedure: Generator replacement  With downgerade of ICD to pacer with pirating of previous implanted Medtronic RV lead repair of the atrial lead and capping of the RIATA lead  Following informed consent the patient was brought to the electrophysiology laboratory in place of the fluoroscopic table in the supine position after routine prep and drape lidocaine was infiltrated in the region of the previous incision and carried down to later the device pocket using sharp dissection and electrocautery. The pocket was opened the device was freed up and was explanted.  Interrogation of the previously implanted ventricular lead MEDTRONIC  demonstrated an R wave of  0 millivolts., and impedance of 602ohms, and a pacing threshold of 1.1 volts at 0.5 msec.    The previously implanted atrial lead Medtonic 4068 P-wave amplitude of 0.6 illlivolts  and impedance of  240 ohms, and a pacing threshold of 1.0volts at  0.55milliseconds.  The leads were inspected. The leads were then attached to a st Judes pulse generator, serial number [id number].    The pocket was irrigated with antibiotic containing saline solution hemostasis was assured and the leads and the device were placed in the pocket. The wound is then closed in 3 layers in normal fashion.  The patient tolerated the procedure without apparent complication.  Sherryl Manges

## 2011-12-28 ENCOUNTER — Encounter (INDEPENDENT_AMBULATORY_CARE_PROVIDER_SITE_OTHER): Payer: Medicare Other | Admitting: General Surgery

## 2012-01-04 ENCOUNTER — Encounter: Payer: Self-pay | Admitting: Internal Medicine

## 2012-01-04 ENCOUNTER — Ambulatory Visit (INDEPENDENT_AMBULATORY_CARE_PROVIDER_SITE_OTHER): Payer: Medicare Other | Admitting: *Deleted

## 2012-01-04 DIAGNOSIS — I442 Atrioventricular block, complete: Secondary | ICD-10-CM

## 2012-01-04 LAB — PACEMAKER DEVICE OBSERVATION
AL IMPEDENCE PM: 225 Ohm
ATRIAL PACING PM: 40
BAMS-0001: 170 {beats}/min
DEVICE MODEL PM: 7303807
RV LEAD IMPEDENCE PM: 625 Ohm
VENTRICULAR PACING PM: 100

## 2012-01-04 NOTE — Progress Notes (Signed)
Wound check-PPM 

## 2012-02-01 ENCOUNTER — Encounter (INDEPENDENT_AMBULATORY_CARE_PROVIDER_SITE_OTHER): Payer: Self-pay | Admitting: General Surgery

## 2012-02-06 ENCOUNTER — Telehealth: Payer: Self-pay

## 2012-02-06 MED ORDER — NADOLOL 20 MG PO TABS: 20.0000 mg | ORAL_TABLET | Freq: Every day | ORAL | Status: DC

## 2012-02-06 NOTE — Telephone Encounter (Signed)
Refill nadolol

## 2012-02-21 ENCOUNTER — Emergency Department: Payer: Self-pay | Admitting: Emergency Medicine

## 2012-02-22 ENCOUNTER — Ambulatory Visit: Payer: Medicare Other | Admitting: Family Medicine

## 2012-02-24 ENCOUNTER — Telehealth: Payer: Self-pay | Admitting: Internal Medicine

## 2012-02-24 ENCOUNTER — Ambulatory Visit (INDEPENDENT_AMBULATORY_CARE_PROVIDER_SITE_OTHER): Payer: Medicare Other | Admitting: Family Medicine

## 2012-02-24 ENCOUNTER — Encounter: Payer: Self-pay | Admitting: Family Medicine

## 2012-02-24 VITALS — BP 120/72 | HR 87 | Temp 97.6°F | Ht 71.0 in | Wt 148.8 lb

## 2012-02-24 DIAGNOSIS — J069 Acute upper respiratory infection, unspecified: Secondary | ICD-10-CM

## 2012-02-24 MED ORDER — LEVOFLOXACIN 500 MG PO TABS: 500.0000 mg | ORAL_TABLET | Freq: Every day | ORAL | Status: AC

## 2012-02-24 NOTE — Telephone Encounter (Signed)
Seen today. 

## 2012-02-24 NOTE — Progress Notes (Signed)
  Patient Name: [name] Rich Date of Birth: 07/15/1932 Age: 78 y.o. Medical Record Number: [id number] Gender: male Date of Encounter: 02/24/2012  History of Present Illness:  [name] Rich is a 78 y.o. very pleasant male patient who presents with the following:  On Wed, went to the emergency. Gave some a Zmax and some cough syrup.   Last night felt really hot. Coughing in the throat and up in eyes. Per he and wife, was a little altered, but now is back to normal self and AF. Using promethazine-DM cough medication.    Past Medical History, Surgical History, Social History, Family History, Problem List, Medications, and Allergies have been reviewed and updated if relevant.  Review of Systems: ROS: GEN: Acute illness details above GI: Tolerating PO intake GU: maintaining adequate hydration and urination Pulm: No SOB Interactive and getting along well at home.  Otherwise, ROS is as per the HPI.   Physical Examination: Filed Vitals:   02/24/12 0952  BP: 120/72  Pulse: 87  Temp: 97.6 F (36.4 C)  TempSrc: Oral  Height: 5\' 11"  (1.803 m)  Weight: 148 lb 12.8 oz (67.495 kg)  SpO2: 98%    Body mass index is 20.75 kg/(m^2).   GEN: A and O x 3. WDWN. NAD.    ENT: Nose clear, ext NML.  No LAD.  No JVD.  TM's clear. Oropharynx clear.  PULM: Normal WOB, no distress. No crackles, wheezes, rhonchi. CV: RRR, no M/G/R, No rubs, No JVD.   EXT: warm and well-perfused, No c/c/e. PSYCH: Pleasant and conversant.  Assessment and Plan:  1. URI (upper respiratory infection)     Orders Today: No orders of the defined types were placed in this encounter.    Medications Today: Meds ordered this encounter  Medications  . promethazine-dextromethorphan (PROMETHAZINE-DM) 6.25-15 MG/5ML syrup    Sig: Take by mouth 4 (four) times daily as needed.  Marland Kitchen levofloxacin (LEVAQUIN) 500 MG tablet    Sig: Take 1 tablet (500 mg total) by mouth daily.    Dispense:  10 tablet    Refill:  0      Supportive care Treated for any potential bacterial resp pathogen with active Zmax in system Given age, co-morbidities, will have hold a paper script of LVQ, but if fever returns, will fill to broaden abx

## 2012-02-24 NOTE — Telephone Encounter (Signed)
Triage Record Num: [id number] Operator: Thayer Headings Patient Name: [name] Rich Call Date & Time: 02/23/2012 8:31:49PM Patient Phone: [phone] PCP: Tillman Abide Patient Gender: Male PCP Fax : [phone] Patient DOB: [birth year] Practice Name[facility] Reason for Call: Caller: Polly/Spouse; PCP: Tillman Abide I.; CB#: [phone]; Calling tonight 02/23/12 regarding Was seen in ED on 02/21/12 and dx with mild bronchitis or URI, was prescribed Zithromax, and promethazine DM syrup, took last ABX today, did not start running fever until tonight. Does not have a working thermometer to take temp, however feels hot. Pt does have heart hx and also pacemaker. Not in any respiratory distress at this time. Emergent symptoms r/o by URI guidelines with exception of any temperature elevation of an immunocompromised individual or frail elderly. Care advice given. Dr. Debby Bud paged and he advised this particular ABX will stay in the system for 10 days, however if pt feeling worse, ok to schedule appt for tomorrow, does not need to be seen at ED tonight unless symptoms worsen. Appt scheduled for tomorrow 02/24/12 with Dr. Patsy Lager at 9:45 AM per wife's request. Protocol(s) Used: Upper Respiratory Infection (URI) Recommended Outcome per Protocol: Call Provider Immediately Reason for Outcome: Any temperature elevation in an immunocompromised individual OR frail elderly Care Advice: ~ Another adult should drive. ~ SYMPTOM / CONDITION MANAGEMENT Analgesic/Antipyretic Advice - Acetaminophen: Consider acetaminophen as directed on label or by pharmacist/provider for pain or fever. PRECAUTIONS: - Use only if there is no history of liver disease, alcoholism, or intake of three or more alcohol drinks per day. - If approved by provider when breastfeeding. - Do not exceed recommended dose or frequency.

## 2012-03-13 ENCOUNTER — Encounter: Payer: Self-pay | Admitting: Internal Medicine

## 2012-03-13 ENCOUNTER — Telehealth: Payer: Self-pay | Admitting: Internal Medicine

## 2012-03-13 ENCOUNTER — Ambulatory Visit (INDEPENDENT_AMBULATORY_CARE_PROVIDER_SITE_OTHER): Payer: Medicare Other | Admitting: Internal Medicine

## 2012-03-13 ENCOUNTER — Telehealth (INDEPENDENT_AMBULATORY_CARE_PROVIDER_SITE_OTHER): Payer: Self-pay | Admitting: General Surgery

## 2012-03-13 VITALS — BP 120/60 | HR 73 | Temp 98.0°F | Wt 148.0 lb

## 2012-03-13 DIAGNOSIS — K4090 Unilateral inguinal hernia, without obstruction or gangrene, not specified as recurrent: Secondary | ICD-10-CM

## 2012-03-13 MED ORDER — HYDROCODONE-ACETAMINOPHEN 5-325 MG PO TABS: 1.0000 | ORAL_TABLET | Freq: Four times a day (QID) | ORAL | Status: DC | PRN

## 2012-03-13 NOTE — Telephone Encounter (Signed)
Triage Record Num: [id number] Operator: Sula Rumple Patient Name: [name] Rich Call Date & Time: 03/12/2012 8:47:31PM Patient Phone: [phone] PCP: Tillman Abide Patient Gender: Male PCP Fax : [phone] Patient DOB: [birth year] Practice Name[facility] Reason for Call: Caller: Polly/Spouse; PCP: Tillman Abide I.; CB#: [phone]; Call regarding Hernia, Pain; Wife/Polly calling on 03/12/12 states pt has hernia pain/Lt groin/hernia has "popped" out/has been in pain for last 1.5 hours/advised to have pt seen per Abd Pain protocol Protocol(s) Used: Abdominal Pain Recommended Outcome per Protocol: See ED Immediately Reason for Outcome: Unbearable abdominal/pelvic pain Care Advice: ~ 03/12/2012 8:59:31PM Page 1 of 1 [email or web address]

## 2012-03-13 NOTE — Assessment & Plan Note (Signed)
Bad spell last night Not clear if it may have temporarily incarcerated It is relatively small Discussed ice, hydrocodone prn Will try to get [facility] appt pushed up to this week

## 2012-03-13 NOTE — Telephone Encounter (Signed)
appt made today at 12:30

## 2012-03-13 NOTE — Patient Instructions (Signed)
Please call [facility] and try to get next week's appt pushed up to ASAP Had attack of severe pain in hernia last night

## 2012-03-13 NOTE — Telephone Encounter (Signed)
Please check on him this morning Can add on at 12:30 today if he wasn't seen last night

## 2012-03-13 NOTE — Telephone Encounter (Signed)
[name] Rich at [employer] about pt with Lt inguinal hernia.  Pt has rescheduled two appts (Jan and Feb) to Apr 12 with Dr. Andrey Campanile.  He had bronchitis "attack" last night and then experienced "excrutiating pain," for which he called EMS.  The probable incarcerated hernia was resolved and he has gone to his PCP today.  They are calling to try to move his appt forward if possible.  They understand the chances are unlikely, but would like to be on a cancellation list.  It will take 45-60 minutes for him to commute in from his home in [place].

## 2012-03-13 NOTE — Progress Notes (Signed)
Subjective:    Patient ID: [name] Rich, male    DOB: 09-27-1932, 78 y.o.   MRN: [id number]  HPI Left hernia has been worse ever since he had bronchitis and coughing Had been set up with surgeon but canceled due to pacemaker procedure---rescheduled for next week  Bowels have been okay Appetite is good  Generally okay when supine---reduces apparently More trouble when standing Got real bad last night Evaluated by EMS but they deferred transfer  Current Outpatient Prescriptions on File Prior to Visit  Medication Sig Dispense Refill  . aspirin 81 MG tablet Take 81 mg by mouth daily.        . clopidogrel (PLAVIX) 75 MG tablet Take 1 tablet (75 mg total) by mouth daily.  90 tablet  3  . Multiple Vitamins-Minerals (CENTRUM SILVER PO) Take 1 tablet by mouth daily.        . nadolol (CORGARD) 20 MG tablet Take 1 tablet (20 mg total) by mouth daily.  30 tablet  6    Allergies  Allergen Reactions  . Atorvastatin     REACTION: confusion  . Simvastatin     REACTION: zombie    Past Medical History  Diagnosis Date  . CHF (congestive heart failure)   . Coronary artery disease     s/p CABG  . Ischemic cardiomyopathy   . Colon, diverticulosis   . GERD (gastroesophageal reflux disease)   . BPH (benign prostatic hyperplasia)     Dr. Achilles Rich  . Heart block     pacer/defib 6/06-----Dr. Graciela Rich  . AAA (abdominal aortic aneurysm)   . Hyperlipidemia   . Colon cancer 1993  . Cognitive impairment     mild  . Hyperplastic colon polyp     Recurrent  . Hernia 2013    Past Surgical History  Procedure Date  . Coronary artery bypass graft 1997  . Colon resection 1993    R COLON - Cecal cancer  . Cholecystectomy   . Pacemaker insertion 02/1996  . Prostate biopsy 03/2003    negative  . Icd 05/2005    ([name] Rich) St Jude Atlas Plus  . Cardiac catheterization   . Insert / replace / remove pacemaker 2006    pacer/defib  (St Jude)  . Cardiolite  12/1999    negative  . Adenosine stress  05/2005   Fixed AMT defect, LV 30% (Dilated)  . Adenosine myoview 11/2006    Low risk  . Coronary angioplasty 12/2006    No significant problems  . Recurrent hyperplastic polyps     No family history on file.  History   Social History  . Marital Status: Married    Spouse Name: N/A    Number of Children: 3  . Years of Education: N/A   Occupational History  . retired [employer]   . Previously [employer]    Social History Main Topics  . Smoking status: Former Smoker -- 1.0 packs/day    Types: Cigarettes    Quit date: 03/13/1996  . Smokeless tobacco: Never Used  . Alcohol Use: No  . Drug Use: No  . Sexually Active: Not on file   Other Topics Concern  . Not on file   Social History Narrative   Married with 2 sons, 1 daughter   Review of Systems     Objective:   Physical Exam  Abdominal: Soft. There is no tenderness.       Small left inguinal hernia Not incarcerated or tender  Assessment & Plan:

## 2012-03-14 NOTE — Telephone Encounter (Signed)
Note placed with reminders to call if something opens sooner.

## 2012-03-16 ENCOUNTER — Ambulatory Visit (INDEPENDENT_AMBULATORY_CARE_PROVIDER_SITE_OTHER): Payer: Medicare Other | Admitting: *Deleted

## 2012-03-16 ENCOUNTER — Encounter: Payer: Self-pay | Admitting: Internal Medicine

## 2012-03-16 DIAGNOSIS — I428 Other cardiomyopathies: Secondary | ICD-10-CM

## 2012-03-16 DIAGNOSIS — I442 Atrioventricular block, complete: Secondary | ICD-10-CM

## 2012-03-16 LAB — PACEMAKER DEVICE OBSERVATION
AL AMPLITUDE: 0.8 mv
BAMS-0001: 170 {beats}/min
BAMS-0003: 70 {beats}/min
DEVICE MODEL PM: 7303807
VENTRICULAR PACING PM: 99

## 2012-03-16 NOTE — Progress Notes (Signed)
PPM check 

## 2012-03-23 ENCOUNTER — Telehealth: Payer: Self-pay

## 2012-03-23 ENCOUNTER — Ambulatory Visit (INDEPENDENT_AMBULATORY_CARE_PROVIDER_SITE_OTHER): Payer: Medicare Other | Admitting: General Surgery

## 2012-03-23 ENCOUNTER — Encounter (INDEPENDENT_AMBULATORY_CARE_PROVIDER_SITE_OTHER): Payer: Self-pay | Admitting: General Surgery

## 2012-03-23 VITALS — BP 112/64 | HR 76 | Temp 98.8°F | Resp 18 | Ht 71.0 in | Wt 148.8 lb

## 2012-03-23 DIAGNOSIS — K4090 Unilateral inguinal hernia, without obstruction or gangrene, not specified as recurrent: Secondary | ICD-10-CM

## 2012-03-23 MED ORDER — TAMSULOSIN HCL 0.4 MG PO CAPS: 0.4000 mg | ORAL_CAPSULE | Freq: Every day | ORAL | Status: DC

## 2012-03-23 NOTE — Telephone Encounter (Signed)
[name] Rich pts son left v/m that pt kept scheduled appt today for hernia consultation at [facility]([name] Rich stated pt was not scheduled with a  Particular doctor). Pt saw Dr Gaynelle Adu and Jorja Loa said needs another surgical referral in same group but with specific recommendation from Dr Alphonsus Sias. Pt said Dr Andrey Campanile was not compatible with his father and wanted a physician with a nicer bedside manor. Please call [name] Rich at [phone] or pts wife [name] Rich at [phone].

## 2012-03-23 NOTE — Progress Notes (Signed)
Patient ID: [name] Rich, male   DOB: 07-13-1932, 78 y.o.   MRN: [id number]  Chief Complaint  Patient presents with  . Inguinal Hernia    new pt- eval [facility]    HPI [name] Rich is a 78 y.o. male.   HPI This 78 year old Caucasian male referred by Dr. Alphonsus Sias for evaluation of a left inguinal hernia. The patient has dementia and is unable to participate in his exam. The majority of information comes from his wife. She states that the hernia is been there for about 5-6 months. It initially did not bother him. However about a week and a half ago he had an episode of severe pain in his left groin. There was a bulge at that time. She called EMS. However by the time EMS arrived the pain had resolved and the bulge was reduced. She states that he hasn't had any nausea or vomiting or diarrhea or constipation. She states that he hasn't complained of any chest pain or chest pressure. She states that he has dementia and or complained of any shortness of breath. He has had a prior open cholecystectomy and a partial colectomy.  The wife is interested in the patient avoiding general anesthesia because of his dementia Past Medical History  Diagnosis Date  . CHF (congestive heart failure)   . Coronary artery disease     s/p CABG  . Ischemic cardiomyopathy   . Colon, diverticulosis   . GERD (gastroesophageal reflux disease)   . BPH (benign prostatic hyperplasia)     Dr. Achilles Dunk  . Heart block     pacer/defib 6/06-----Dr. Graciela Husbands  . AAA (abdominal aortic aneurysm)   . Hyperlipidemia   . Colon cancer 1993  . Cognitive impairment     mild  . Hyperplastic colon polyp     Recurrent  . Hernia 2013    Past Surgical History  Procedure Date  . Coronary artery bypass graft 1997  . Colon resection 1993    R COLON - Cecal cancer  . Cholecystectomy   . Pacemaker insertion 02/1996  . Prostate biopsy 03/2003    negative  . Icd 05/2005    ([facility]) St Jude Atlas Plus  . Cardiac catheterization   . Insert / replace /  remove pacemaker 2006    pacer/defib  (St Jude)  . Cardiolite  12/1999    negative  . Adenosine stress  05/2005    Fixed AMT defect, LV 30% (Dilated)  . Adenosine myoview 11/2006    Low risk  . Coronary angioplasty 12/2006    No significant problems  . Recurrent hyperplastic polyps     Family History  Problem Relation Age of Onset  . Heart disease Mother   . Heart disease Father     Social History History  Substance Use Topics  . Smoking status: Former Smoker -- 1.0 packs/day    Types: Cigarettes    Quit date: 03/13/1996  . Smokeless tobacco: Never Used  . Alcohol Use: Yes     rarely    Allergies  Allergen Reactions  . Atorvastatin     REACTION: confusion  . Simvastatin     REACTION: zombie    Current Outpatient Prescriptions  Medication Sig Dispense Refill  . aspirin 81 MG tablet Take 81 mg by mouth daily.        . clopidogrel (PLAVIX) 75 MG tablet Take 1 tablet (75 mg total) by mouth daily.  90 tablet  3  . HYDROcodone-acetaminophen (NORCO) 5-325 MG per tablet  Take 1 tablet by mouth every 6 (six) hours as needed for pain.  30 tablet  0  . Multiple Vitamins-Minerals (CENTRUM SILVER PO) Take 1 tablet by mouth daily.        . nadolol (CORGARD) 20 MG tablet Take 1 tablet (20 mg total) by mouth daily.  30 tablet  6  . Tamsulosin HCl (FLOMAX) 0.4 MG CAPS Take 1 capsule (0.4 mg total) by mouth daily.  10 capsule  0    Review of Systems Review of Systems  Unable to perform ROS: Dementia    Blood pressure 112/64, pulse 76, temperature 98.8 F (37.1 C), temperature source Temporal, resp. rate 18, height 5\' 11"  (1.803 m), weight 148 lb 12.8 oz (67.495 kg).  Physical Exam Physical Exam  Vitals reviewed. Constitutional: He appears well-developed and well-nourished. No distress.  HENT:  Head: Normocephalic and atraumatic.  Right Ear: External ear normal.  Left Ear: External ear normal.  Eyes: Conjunctivae are normal. No scleral icterus.  Neck: Normal range of  motion. Neck supple. No tracheal deviation present. No thyromegaly present.  Cardiovascular: Normal rate, regular rhythm and normal pulses.        Paced; +pacemaker left chest wall  Pulmonary/Chest: Effort normal and breath sounds normal. No respiratory distress. He has no wheezes.  Abdominal: Soft. Bowel sounds are normal. He exhibits no distension. There is no tenderness. A hernia is present. Hernia confirmed positive in the left inguinal area (reducible).    Genitourinary: Right testis shows no mass. Left testis shows no mass.  Musculoskeletal: Normal range of motion. He exhibits no edema and no tenderness.  Lymphadenopathy:    He has no cervical adenopathy.  Neurological: He is alert. He is disoriented. He displays no tremor. No cranial nerve deficit.       Can't name president; [name]; cant name month or day of week  Skin: He is not diaphoretic.  Psychiatric: He is agitated (agitated at times). Cognition and memory are impaired.    Data Reviewed Dr Karle Starch note  Assessment    Left inguinal hernia    Plan    We discussed the etiology of inguinal hernias. We discussed the signs & symptoms of incarceration & strangulation.  We discussed non-operative and operative management. Since the wife would like to avoid general anesthesia b/c of the pt's dementia, laparoscopic approach was not offered. We discussed open repair.  The patient's wife has elected for an OPEN REPAIR OF LEFT INGUINAL HERNIA WITH MESH   I described the procedure in detail.  The patient and wife were given educational material. We discussed the risks and benefits including but not limited to bleeding, infection, chronic inguinal pain, nerve entrapment, hernia recurrence, mesh complications, hematoma formation, urinary retention, injury to the testicles, numbness in the groin, blood clots, injury to the surrounding structures, and anesthesia risk. We also discussed the typical post operative recovery course,  including no heavy lifting for 4-6 weeks. I explained that the likelihood of improvement of their symptoms is good.   I explained that we can do the hernia repair without general anesthesia most of the time. However, if for some reason, he can not tolerate surgery under the planned anesthetic I do reserve the right to switch to general anesthesia.  I explained that we will need to get cardiology clearance from his cardiologist prior to scheduling surgery. I would also like to get permission to hold his plavix pre-operatively to decrease his bleeding risk unless contraindicated. Once we get clearance, our office  will contact them to schedule surgery. I explained that given his heart history he would need to have surgery at [facility] and I would keep him overnight for observation. We will also arrange a anesthesia consult.   Mary Sella. Andrey Campanile, MD, FACS General, Bariatric, & Minimally Invasive Surgery [facility], [place]         [facility] M 03/23/2012, 7:05 PM

## 2012-03-23 NOTE — Patient Instructions (Signed)
Start taking the flomax 2 days before surgery.  Once we have cardiology clearance, our office will contact you to schedule surgery

## 2012-03-24 NOTE — Telephone Encounter (Signed)
Marion,  Please call [facility] I did review the note and he focused a lot on the dementia and mentioned uncooperativeness, etc (which I really never note in my visits) Find out if they have a recommendation for another surgeon who may do better regarding the patient's mild dementia. Then follow up with wife and son Thanks

## 2012-03-26 ENCOUNTER — Telehealth: Payer: Self-pay

## 2012-03-26 NOTE — Telephone Encounter (Signed)
pts wife said pt was fitted by [facility] for a truss for hernia. Pt wants medicare order for truss faxed to [facility] at [phone]. Pts wife can be reached at [phone].

## 2012-03-26 NOTE — Telephone Encounter (Signed)
Rx written.

## 2012-03-27 ENCOUNTER — Telehealth: Payer: Self-pay | Admitting: Internal Medicine

## 2012-03-27 NOTE — Telephone Encounter (Signed)
LHB pt. Will have Dr. Graciela Husbands review and also forward to triage.

## 2012-03-27 NOTE — Telephone Encounter (Signed)
rx faxed Spoke with patient's wife and advised results

## 2012-03-27 NOTE — Telephone Encounter (Signed)
Mr Vanover was told he has a hernia.  In order to have it fixed, Mr Isakson needs to come off his Plavix x 5 days and they would have to turn the pacemaker off per his surgeon.  The surgery would be done by a surgeon at [facility] and would be a local spinal block due to his dementia.  Mrs Caswell is very concerned about him having surgery as it is too risky.  She would like Dr Odessa Fleming opinion on it.

## 2012-03-27 NOTE — Telephone Encounter (Signed)
Pt needs surgical clearance and pt wife would like to speak to a nurse concerning this procedure.

## 2012-03-27 NOTE — Telephone Encounter (Signed)
Spoke with wife  For now they are planning to defer surgery

## 2012-04-06 ENCOUNTER — Ambulatory Visit (INDEPENDENT_AMBULATORY_CARE_PROVIDER_SITE_OTHER): Payer: Self-pay | Admitting: Surgery

## 2012-04-19 ENCOUNTER — Telehealth: Payer: Self-pay | Admitting: *Deleted

## 2012-04-19 ENCOUNTER — Encounter (INDEPENDENT_AMBULATORY_CARE_PROVIDER_SITE_OTHER): Payer: Self-pay | Admitting: General Surgery

## 2012-04-19 ENCOUNTER — Ambulatory Visit (INDEPENDENT_AMBULATORY_CARE_PROVIDER_SITE_OTHER): Payer: Medicare Other | Admitting: General Surgery

## 2012-04-19 ENCOUNTER — Telehealth (INDEPENDENT_AMBULATORY_CARE_PROVIDER_SITE_OTHER): Payer: Self-pay | Admitting: General Surgery

## 2012-04-19 VITALS — BP 124/61 | HR 76 | Temp 97.9°F | Ht 71.0 in | Wt 146.0 lb

## 2012-04-19 DIAGNOSIS — K4090 Unilateral inguinal hernia, without obstruction or gangrene, not specified as recurrent: Secondary | ICD-10-CM

## 2012-04-19 NOTE — Telephone Encounter (Signed)
New problem:  Per Pattricia Boss calling from [facility]. Regarding  Cardiac clearance.

## 2012-04-19 NOTE — Telephone Encounter (Signed)
Got a call from Dr Graciela Husbands office to let us know that Dr Graciela Husbands is not in favor of surgery and I told Denyse Amass that Mr Devita has a appt back in a mouth to see Dr Carolynne Edouard and if he wants surgery we will go from there and pt wife is also aware of this

## 2012-04-19 NOTE — Telephone Encounter (Signed)
[name] Rich as [facility] to contact pt and determine if they even want to have surgery at this point before we start the surgical clearance process.

## 2012-04-19 NOTE — Telephone Encounter (Signed)
Amy from [facility] calling concerning mr [name] Rich--called [facility] and LM for Amy to call back--nt

## 2012-04-20 ENCOUNTER — Ambulatory Visit (INDEPENDENT_AMBULATORY_CARE_PROVIDER_SITE_OTHER)
Admission: RE | Admit: 2012-04-20 | Discharge: 2012-04-20 | Disposition: A | Discharge status: Home / Self Care | Payer: Medicare Other | Source: Ambulatory Visit | Attending: Internal Medicine | Admitting: Internal Medicine

## 2012-04-20 ENCOUNTER — Ambulatory Visit (INDEPENDENT_AMBULATORY_CARE_PROVIDER_SITE_OTHER): Payer: Medicare Other | Admitting: Internal Medicine

## 2012-04-20 ENCOUNTER — Ambulatory Visit (INDEPENDENT_AMBULATORY_CARE_PROVIDER_SITE_OTHER): Payer: Self-pay | Admitting: General Surgery

## 2012-04-20 ENCOUNTER — Encounter: Payer: Self-pay | Admitting: Internal Medicine

## 2012-04-20 VITALS — BP 128/80 | HR 72 | Temp 97.6°F | Wt 145.0 lb

## 2012-04-20 DIAGNOSIS — R05 Cough: Secondary | ICD-10-CM

## 2012-04-20 NOTE — Assessment & Plan Note (Addendum)
Persists Probably allergy related Not sick CXR looks fine Will just observe May want to take 2nd loratadine daily

## 2012-04-20 NOTE — Progress Notes (Signed)
Subjective:    Patient ID: [name] Rich, male    DOB: August 03, 1932, 78 y.o.   MRN: [id number]  HPI Has never stopped coughing since bronchitis in March Cough is intermittent but seems like there is chest congestion He thinks it might be pollen related Does take claritin daily  No fever Cough is only occ productive--white  Current Outpatient Prescriptions on File Prior to Visit  Medication Sig Dispense Refill  . aspirin 81 MG tablet Take 81 mg by mouth daily.        . clopidogrel (PLAVIX) 75 MG tablet Take 1 tablet (75 mg total) by mouth daily.  90 tablet  3  . Multiple Vitamins-Minerals (CENTRUM SILVER PO) Take 1 tablet by mouth daily.        . nadolol (CORGARD) 20 MG tablet Take 1 tablet (20 mg total) by mouth daily.  30 tablet  6    Allergies  Allergen Reactions  . Atorvastatin     REACTION: confusion  . Simvastatin     REACTION: zombie    Past Medical History  Diagnosis Date  . CHF (congestive heart failure)   . Coronary artery disease     s/p CABG  . Ischemic cardiomyopathy   . Colon, diverticulosis   . GERD (gastroesophageal reflux disease)   . BPH (benign prostatic hyperplasia)     Dr. Achilles Dunk  . Heart block     pacer/defib 6/06-----Dr. Graciela Husbands  . AAA (abdominal aortic aneurysm)   . Hyperlipidemia   . Colon cancer 1993  . Cognitive impairment     mild  . Hyperplastic colon polyp     Recurrent  . Hernia 2013    Past Surgical History  Procedure Date  . Coronary artery bypass graft 1997  . Colon resection 1993    R COLON - Cecal cancer  . Cholecystectomy   . Pacemaker insertion 02/1996  . Prostate biopsy 03/2003    negative  . Icd 05/2005    (TYLER) St Jude Atlas Plus  . Cardiac catheterization   . Insert / replace / remove pacemaker 2006    pacer/defib  (St Jude)  . Cardiolite  12/1999    negative  . Adenosine stress  05/2005    Fixed AMT defect, LV 30% (Dilated)  . Adenosine myoview 11/2006    Low risk  . Coronary angioplasty 12/2006    No significant  problems  . Recurrent hyperplastic polyps     Family History  Problem Relation Age of Onset  . Heart disease Mother   . Heart disease Father     History   Social History  . Marital Status: Married    Spouse Name: N/A    Number of Children: 3  . Years of Education: N/A   Occupational History  . retired [employer]   . Previously [employer]    Social History Main Topics  . Smoking status: Former Smoker -- 1.0 packs/day    Types: Cigarettes    Quit date: 03/13/1996  . Smokeless tobacco: Never Used  . Alcohol Use: Yes     rarely  . Drug Use: No  . Sexually Active: Not on file   Other Topics Concern  . Not on file   Social History Narrative   Married with 2 sons, 1 daughter   Review of Systems Appetite is fine No nausea or vomiting No PND No heartburn problems    Objective:   Physical Exam  Constitutional: He appears well-developed. No distress.  HENT:  Mouth/Throat: Oropharynx  is clear and moist. No oropharyngeal exudate.       Mild pale nasal congestion  Neck: Normal range of motion. Neck supple. No thyromegaly present.  Cardiovascular: Normal rate, regular rhythm and normal heart sounds.  Exam reveals no gallop.   No murmur heard. Pulmonary/Chest: Effort normal and breath sounds normal. No respiratory distress. He has no wheezes. He has no rales.  Musculoskeletal: He exhibits no edema.  Lymphadenopathy:    He has no cervical adenopathy.          Assessment & Plan:

## 2012-04-20 NOTE — Progress Notes (Signed)
Subjective:     Patient ID: [name] Rich, male   DOB: 1932-03-13, 78 y.o.   MRN: [id number]  HPI The patient is a 78 year old white male with significant dementia and cardiac comorbid conditions who recently saw Dr. Andrey Campanile to be evaluated for a left inguinal hernia. The family would like a second opinion. Apparently the patient had an episode of significant left groin pain several weeks ago. He was able to lie down and get the hernia to go back in. He denied any nausea or vomiting. Since that time he has been wearing a truss to try to keep the hernia in. This seems to be working reasonably well formed but he does occasionally get some discomfort in the groin that seems to radiate down the front of the leg. He is actually very functional and has even been mowing the lawn recently.  Review of Systems  Constitutional: Negative.   HENT: Negative.   Eyes: Negative.   Respiratory: Negative.   Cardiovascular: Negative.   Gastrointestinal: Positive for abdominal pain.  Genitourinary: Negative.   Musculoskeletal: Negative.   Skin: Negative.   Neurological: Negative.   Hematological: Negative.   Psychiatric/Behavioral: Positive for confusion and agitation.       Objective:   Physical Exam  Constitutional: He is oriented to person, place, and time. He appears well-developed and well-nourished.  HENT:  Head: Normocephalic and atraumatic.  Eyes: Conjunctivae and EOM are normal. Pupils are equal, round, and reactive to light.  Neck: Normal range of motion. Neck supple.  Cardiovascular: Regular rhythm.        Pacemaker in place on the left chest  Pulmonary/Chest: Effort normal and breath sounds normal.  Abdominal: Bowel sounds are normal.  Genitourinary:       There is an easily reducible moderate-sized left inguinal hernia. No fullness or impulse with straining in the right groin  Musculoskeletal: Normal range of motion.  Neurological: He is alert and oriented to person, place, and time.  Skin:  Skin is warm and dry.  Psychiatric:       The patient seems pleasantly confused at times and at other times fairly coherent       Assessment:     Symptomatic left inguinal hernia in a patient with significant dementia and cardiac comorbidities    Plan:     At this point I think technically we could fix the hernia for him because of the risk of incarceration and strangulation. I believe it would be difficult to do it with him awake under local anesthetic because of his confusion and the possibility of him becoming combative. If we were to fix it I think it would be most safely done under a short general anesthetic. I believe he is high risk for a cardiac complication. We will therefore asked for cardiac clearance from his cardiologist and then we will have another discussion with his family about what would be best for him

## 2012-05-14 ENCOUNTER — Encounter (INDEPENDENT_AMBULATORY_CARE_PROVIDER_SITE_OTHER): Payer: Self-pay | Admitting: General Surgery

## 2012-05-14 ENCOUNTER — Ambulatory Visit (INDEPENDENT_AMBULATORY_CARE_PROVIDER_SITE_OTHER): Payer: Medicare Other | Admitting: General Surgery

## 2012-05-14 VITALS — BP 138/78 | HR 80 | Temp 98.2°F | Ht 69.0 in | Wt 145.8 lb

## 2012-05-14 DIAGNOSIS — K4090 Unilateral inguinal hernia, without obstruction or gangrene, not specified as recurrent: Secondary | ICD-10-CM

## 2012-05-17 NOTE — Telephone Encounter (Signed)
[name] Rich can this note be closed?

## 2012-05-28 ENCOUNTER — Encounter (INDEPENDENT_AMBULATORY_CARE_PROVIDER_SITE_OTHER): Payer: Self-pay | Admitting: General Surgery

## 2012-05-28 NOTE — Progress Notes (Signed)
Subjective:     Patient ID: [name] Rich, male   DOB: 1932/06/18, 78 y.o.   MRN: [id number]  HPI The patient is a 78 year old white male with a known left inguinal hernia. He also has significant dementia and cardiac comorbidities. Since his last visit he has had no pain associated with this hernia. He wears a truss all the time which keeps it in and this seems to work well for him. Since the last visit we have not seen the cardiac clearance comes through for him.  Review of Systems  Constitutional: Negative.   HENT: Negative.   Eyes: Negative.   Respiratory: Negative.   Cardiovascular: Negative.   Gastrointestinal: Negative.   Genitourinary: Negative.   Musculoskeletal: Negative.   Skin: Negative.   Neurological: Negative.   Hematological: Negative.   Psychiatric/Behavioral: Negative.        Objective:   Physical Exam  Constitutional: He is oriented to person, place, and time. He appears well-developed and well-nourished.  HENT:  Head: Normocephalic and atraumatic.  Eyes: Conjunctivae and EOM are normal. Pupils are equal, round, and reactive to light.  Neck: Normal range of motion. Neck supple.  Cardiovascular: Normal rate, regular rhythm and normal heart sounds.   Pulmonary/Chest: Effort normal and breath sounds normal.  Abdominal: Soft. Bowel sounds are normal.  Genitourinary:       The patient has a reducible nontender left inguinal hernia  Musculoskeletal: Normal range of motion.  Neurological: He is alert and oriented to person, place, and time.  Skin: Skin is warm and dry.  Psychiatric: He has a normal mood and affect. His behavior is normal.       Assessment:     At this point the patient has what appears to be an asymptomatic left inguinal hernia and significant comorbid conditions.    Plan:     I am talking to family in depth about this condition. We will continue to try to get cardiac clearance. The patient and family are favoring a nonsurgical approach  and I think as long as he is asymptomatic this is a reasonable thing. We will plan to see him back in about 3 months to check his progress. He agrees to call us if he has any problems in the meantime

## 2012-06-18 ENCOUNTER — Encounter: Payer: Self-pay | Admitting: Internal Medicine

## 2012-06-18 ENCOUNTER — Ambulatory Visit (INDEPENDENT_AMBULATORY_CARE_PROVIDER_SITE_OTHER): Payer: Medicare Other | Admitting: Internal Medicine

## 2012-06-18 VITALS — BP 118/60 | HR 76 | Temp 98.6°F | Ht 69.0 in | Wt 147.0 lb

## 2012-06-18 DIAGNOSIS — G309 Alzheimer's disease, unspecified: Secondary | ICD-10-CM | POA: Insufficient documentation

## 2012-06-18 DIAGNOSIS — F068 Other specified mental disorders due to known physiological condition: Secondary | ICD-10-CM

## 2012-06-18 DIAGNOSIS — N400 Enlarged prostate without lower urinary tract symptoms: Secondary | ICD-10-CM

## 2012-06-18 DIAGNOSIS — K4090 Unilateral inguinal hernia, without obstruction or gangrene, not specified as recurrent: Secondary | ICD-10-CM

## 2012-06-18 DIAGNOSIS — F0280 Dementia in other diseases classified elsewhere without behavioral disturbance: Secondary | ICD-10-CM

## 2012-06-18 DIAGNOSIS — I10 Essential (primary) hypertension: Secondary | ICD-10-CM

## 2012-06-18 NOTE — Progress Notes (Signed)
Subjective:    Patient ID: [name] Rich, male    DOB: [birth year], 78 y.o.   MRN: [id number]  HPI Here with wife  Has seen Dr Carolynne Edouard Still using observation for hernia Wearing truss and this helps  Has seen Dr Achilles Dunk for urinary retention He has deferred cysto or definitive Rx for prostatic enlargement Dizzy on flomax and it didn't help CT scan scheduled May consider surgery--without general anaesthesia  Ongoing memory problems---some worsening per wife Independent with ADLs and some instrumental ADLs Still drives briefly to mall in AM to do his walking Unable to do errands like grocery shopping though Communication skills have declined Gets irritated regularly---frustrated. Able to redirect him  No chest pain No SOB  Current Outpatient Prescriptions on File Prior to Visit  Medication Sig Dispense Refill  . aspirin 81 MG tablet Take 81 mg by mouth daily.        . clopidogrel (PLAVIX) 75 MG tablet Take 1 tablet (75 mg total) by mouth daily.  90 tablet  3  . Multiple Vitamins-Minerals (CENTRUM SILVER PO) Take 1 tablet by mouth daily.        . nadolol (CORGARD) 20 MG tablet Take 1 tablet (20 mg total) by mouth daily.  30 tablet  6    Allergies  Allergen Reactions  . Atorvastatin     REACTION: confusion  . Simvastatin     REACTION: zombie    Past Medical History  Diagnosis Date  . CHF (congestive heart failure)   . Coronary artery disease     s/p CABG  . Ischemic cardiomyopathy   . Colon, diverticulosis   . GERD (gastroesophageal reflux disease)   . BPH (benign prostatic hyperplasia)     Dr. Achilles Dunk  . Heart block     pacer/defib 6/06-----Dr. Graciela Husbands  . AAA (abdominal aortic aneurysm)   . Hyperlipidemia   . Colon cancer 1993  . Cognitive impairment     mild  . Hyperplastic colon polyp     Recurrent  . Hernia 2013    Past Surgical History  Procedure Date  . Coronary artery bypass graft 1997  . Colon resection 1993    R COLON - Cecal cancer  . Cholecystectomy    . Pacemaker insertion 02/1996  . Prostate biopsy 03/2003    negative  . Icd 05/2005    (TYLER) St Jude Atlas Plus  . Cardiac catheterization   . Insert / replace / remove pacemaker 2006    pacer/defib  (St Jude)  . Cardiolite  12/1999    negative  . Adenosine stress  05/2005    Fixed AMT defect, LV 30% (Dilated)  . Adenosine myoview 11/2006    Low risk  . Coronary angioplasty 12/2006    No significant problems  . Recurrent hyperplastic polyps     Family History  Problem Relation Age of Onset  . Heart disease Mother   . Heart disease Father     History   Social History  . Marital Status: Married    Spouse Name: N/A    Number of Children: 3  . Years of Education: N/A   Occupational History  . retired [employer]   . Previously [employer]    Social History Main Topics  . Smoking status: Former Smoker -- 1.0 packs/day    Types: Cigarettes    Quit date: 03/13/1996  . Smokeless tobacco: Never Used  . Alcohol Use: Yes     rarely  . Drug Use: No  . Sexually  Active: Not on file   Other Topics Concern  . Not on file   Social History Narrative   Married with 2 sons, 1 daughter   Review of Systems Sleeping fine Appetite is okay Weight up slightly--overall stable    Objective:   Physical Exam  Constitutional: He appears well-developed and well-nourished. No distress.  Neck: Normal range of motion. Neck supple. No thyromegaly present.  Cardiovascular: Normal rate, regular rhythm, normal heart sounds and intact distal pulses.  Exam reveals no gallop.   No murmur heard. Pulmonary/Chest: Effort normal and breath sounds normal. No respiratory distress. He has no wheezes. He has no rales.  Abdominal: Soft. There is no tenderness.  Musculoskeletal: He exhibits no edema and no tenderness.  Lymphadenopathy:    He has no cervical adenopathy.  Neurological:       Oriented to self and knows [place] No idea about month or year Doesn't know   Psychiatric:        Generally cooperative Not depressed          Assessment & Plan:

## 2012-06-18 NOTE — Assessment & Plan Note (Signed)
Increased urinary retention Will probably need procedure from Dr Achilles Dunk

## 2012-06-18 NOTE — Assessment & Plan Note (Signed)
Has progressed from [facility] He adamantly refuses meds Mild behavioral issues which don't require meds Discussed respite for wife

## 2012-06-18 NOTE — Assessment & Plan Note (Signed)
Observation with use of truss only

## 2012-06-18 NOTE — Assessment & Plan Note (Signed)
BP Readings from Last 3 Encounters:  06/18/12 118/60  05/14/12 138/78  04/20/12 128/80   Good control No change

## 2012-06-28 ENCOUNTER — Ambulatory Visit: Payer: Self-pay | Admitting: Urology

## 2012-06-28 ENCOUNTER — Encounter: Payer: Self-pay | Admitting: Physician Assistant

## 2012-07-09 ENCOUNTER — Ambulatory Visit (INDEPENDENT_AMBULATORY_CARE_PROVIDER_SITE_OTHER): Payer: Medicare Other | Admitting: Physician Assistant

## 2012-07-09 ENCOUNTER — Encounter: Payer: Self-pay | Admitting: Physician Assistant

## 2012-07-09 VITALS — BP 126/68 | HR 64 | Ht 72.0 in | Wt 146.0 lb

## 2012-07-09 DIAGNOSIS — Z01810 Encounter for preprocedural cardiovascular examination: Secondary | ICD-10-CM

## 2012-07-09 DIAGNOSIS — N400 Enlarged prostate without lower urinary tract symptoms: Secondary | ICD-10-CM

## 2012-07-09 DIAGNOSIS — I255 Ischemic cardiomyopathy: Secondary | ICD-10-CM

## 2012-07-09 DIAGNOSIS — I2589 Other forms of chronic ischemic heart disease: Secondary | ICD-10-CM

## 2012-07-09 DIAGNOSIS — I2510 Atherosclerotic heart disease of native coronary artery without angina pectoris: Secondary | ICD-10-CM

## 2012-07-09 DIAGNOSIS — I442 Atrioventricular block, complete: Secondary | ICD-10-CM

## 2012-07-09 DIAGNOSIS — I10 Essential (primary) hypertension: Secondary | ICD-10-CM

## 2012-07-09 NOTE — Patient Instructions (Addendum)
STAY ON ASPIRIN 81 MG DAILY  HOLD PLAVIX AT THIS TIME FOR SURGERY PER SCOTT WEAVER, PAC  NO OTHER CHANGES AT THIS TIME  WE WILL FAX OVER SURGERY CLEARANCE TO DR. COATES TODAY

## 2012-07-09 NOTE — Progress Notes (Signed)
[address] [place], [place]  [address] Phone: [phone] Fax:  [phone]  Date:  07/09/2012   Name:  [name] Rich   DOB:  02/03/1932   MRN:  [id number]  PCP:  Tillman Abide, MD  Primary Cardiologist/Primary Electrophysiologist:  Dr. Sherryl Manges    History of Present Illness: [name] Rich is a 78 y.o. male who returns for surgical clearance.  He has a history of CAD, status post CABG, ischemic cardiomyopathy, chronic systolic CHF, complete heart block, status post pacemaker/ICD, HL, AAA, colon cancer, status post resection.  He is s/p DES to the S-OM in 2006.  LHC 12/2006:  mLAD occluded, both obtuse marginal branches occluded, dPDA 30%, PL branches 30%, ramus branch with 30%, RCA 20-30% multiple discrete lesions, LIMA to LAD patent, SVG to OM patent, stent in the mid body of the graft patent, SVG to diagonal patent, EF 45-50%.  Myoview 02/2010: EF 53%, inferoseptal and apical changes consistent with mild scar, no ischemia.  Last abdominal ultrasound 08/2010: Infrarenal fusiform AAA 3.1 x 3.2 cm.    He is here with his wife.  He has significant dementia.  She helps with most of the history.  He walks daily.  He walks about a mile.  He denies chest pain, shortness of breath, syncope.  He denies orthopnea or PND.  He denies edema.  He is able to vacuum the room without stopping for chest pain shortness of breath.  The notes from his urologist.  The plan is to proceed with spinal anesthesia.  Dr. Achilles Dunk with also like him to remain off of Plavix and aspirin for one week prior.  Wt Readings from Last 3 Encounters:  06/18/12 147 lb (66.679 kg)  05/14/12 145 lb 12.8 oz (66.134 kg)  04/20/12 145 lb (65.772 kg)     Past Medical History  Diagnosis Date  . CHF (congestive heart failure)   . Coronary artery disease     s/p CABG  . Ischemic cardiomyopathy   . Colon, diverticulosis   . GERD (gastroesophageal reflux disease)   . BPH (benign prostatic  hyperplasia)     Dr. Achilles Dunk  . Heart block     pacer/defib 6/06-----Dr. Graciela Husbands  . AAA (abdominal aortic aneurysm)   . Hyperlipidemia   . Colon cancer 1993  . Hyperplastic colon polyp     Recurrent  . Hernia 2013  . Dementia of the Alzheimer's type     Current Outpatient Prescriptions  Medication Sig Dispense Refill  . aspirin 81 MG tablet Take 81 mg by mouth daily.        . clopidogrel (PLAVIX) 75 MG tablet Take 1 tablet (75 mg total) by mouth daily.  90 tablet  3  . Multiple Vitamins-Minerals (CENTRUM SILVER PO) Take 1 tablet by mouth daily.        . nadolol (CORGARD) 20 MG tablet Take 1 tablet (20 mg total) by mouth daily.  30 tablet  6    Allergies: Allergies  Allergen Reactions  . Atorvastatin     REACTION: confusion  . Simvastatin     REACTION: zombie    History  Substance Use Topics  . Smoking status: Former Smoker -- 1.0 packs/day    Types: Cigarettes    Quit date: 03/13/1996  . Smokeless tobacco: Never Used  . Alcohol Use: Yes     rarely     ROS:  Please see the history of present illness.     All other systems reviewed  and negative.   PHYSICAL EXAM: VS:  BP 126/68  Pulse 64  Ht 6' (1.829 m)  Wt 146 lb (66.225 kg)  BMI 19.80 kg/m2 Well nourished, well developed, in no acute distress HEENT: normal Neck: no JVD Cardiac:  normal S1, S2; RRR; no murmur Lungs:  clear to auscultation bilaterally, no wheezing, rhonchi or rales Abd: soft, nontender, no hepatomegaly Ext: no edema Skin: warm and dry Neuro:  CNs 2-12 intact, no focal abnormalities noted  EKG:  A sensed, V. Paced, heart rate 64      ASSESSMENT AND PLAN:  1.  CAD Overall stable.  He can achieve 4 METs without anginal symptoms.  I reviewed this with Dr. Delane Ginger (DOD).   He does not require any further cardiac workup prior to his procedure (TURP). He can stop Plavix 5 days before.  He should remain on ASA 81 mg QD. Our service is available as necessary.  2.  Complete heart block, status  post pacemaker Device representative may be contacted as necessary in the peri-op period.  3.  Ischemic cardiomyopathy He will need close attention to volume status in the peri-op period. His volume is currently stable without diuretics.  Signed, Tereso Newcomer, PA-C  2:26 PM 07/09/2012

## 2012-07-10 ENCOUNTER — Ambulatory Visit: Payer: Self-pay | Admitting: Urology

## 2012-07-10 LAB — CBC WITH DIFFERENTIAL/PLATELET
Basophil #: 0 10*3/uL (ref 0.0–0.1)
Eosinophil %: 1.4 %
Lymphocyte #: 1.2 10*3/uL (ref 1.0–3.6)
Lymphocyte %: 19.6 %
Monocyte %: 10.9 %
Neutrophil %: 67.4 %
Platelet: 165 10*3/uL (ref 150–440)
RBC: 4.08 10*6/uL — ABNORMAL LOW (ref 4.40–5.90)
RDW: 13.3 % (ref 11.5–14.5)
WBC: 6.1 10*3/uL (ref 3.8–10.6)

## 2012-07-10 LAB — BASIC METABOLIC PANEL
Chloride: 101 mmol/L (ref 98–107)
Co2: 30 mmol/L (ref 21–32)
Creatinine: 0.83 mg/dL (ref 0.60–1.30)
Sodium: 137 mmol/L (ref 136–145)

## 2012-07-14 ENCOUNTER — Emergency Department (HOSPITAL_COMMUNITY): Payer: Medicare Other

## 2012-07-14 ENCOUNTER — Telehealth: Payer: Self-pay | Admitting: Physician Assistant

## 2012-07-14 ENCOUNTER — Emergency Department (HOSPITAL_COMMUNITY)
Admission: EM | Admit: 2012-07-14 | Discharge: 2012-07-14 | Disposition: A | Discharge status: Home / Self Care | Payer: Medicare Other | Attending: Emergency Medicine | Admitting: Emergency Medicine

## 2012-07-14 ENCOUNTER — Encounter (HOSPITAL_COMMUNITY): Payer: Self-pay | Admitting: *Deleted

## 2012-07-14 DIAGNOSIS — F0280 Dementia in other diseases classified elsewhere without behavioral disturbance: Secondary | ICD-10-CM | POA: Insufficient documentation

## 2012-07-14 DIAGNOSIS — R29898 Other symptoms and signs involving the musculoskeletal system: Secondary | ICD-10-CM | POA: Insufficient documentation

## 2012-07-14 DIAGNOSIS — I2510 Atherosclerotic heart disease of native coronary artery without angina pectoris: Secondary | ICD-10-CM | POA: Insufficient documentation

## 2012-07-14 DIAGNOSIS — K219 Gastro-esophageal reflux disease without esophagitis: Secondary | ICD-10-CM | POA: Insufficient documentation

## 2012-07-14 DIAGNOSIS — Z951 Presence of aortocoronary bypass graft: Secondary | ICD-10-CM | POA: Insufficient documentation

## 2012-07-14 DIAGNOSIS — G309 Alzheimer's disease, unspecified: Secondary | ICD-10-CM | POA: Insufficient documentation

## 2012-07-14 DIAGNOSIS — E785 Hyperlipidemia, unspecified: Secondary | ICD-10-CM | POA: Insufficient documentation

## 2012-07-14 DIAGNOSIS — I509 Heart failure, unspecified: Secondary | ICD-10-CM | POA: Insufficient documentation

## 2012-07-14 DIAGNOSIS — Z87891 Personal history of nicotine dependence: Secondary | ICD-10-CM | POA: Insufficient documentation

## 2012-07-14 DIAGNOSIS — Z7982 Long term (current) use of aspirin: Secondary | ICD-10-CM | POA: Insufficient documentation

## 2012-07-14 LAB — COMPREHENSIVE METABOLIC PANEL
AST: 22 U/L (ref 0–37)
Albumin: 3.5 g/dL (ref 3.5–5.2)
Calcium: 9.6 mg/dL (ref 8.4–10.5)
Creatinine, Ser: 0.89 mg/dL (ref 0.50–1.35)
Total Protein: 6.6 g/dL (ref 6.0–8.3)

## 2012-07-14 LAB — GLUCOSE, CAPILLARY

## 2012-07-14 LAB — CBC WITH DIFFERENTIAL/PLATELET
Basophils Absolute: 0 10*3/uL (ref 0.0–0.1)
Basophils Relative: 0 % (ref 0–1)
Eosinophils Absolute: 0.1 10*3/uL (ref 0.0–0.7)
Eosinophils Relative: 2 % (ref 0–5)
HCT: 40 % (ref 39.0–52.0)
MCH: 33.8 pg (ref 26.0–34.0)
MCHC: 35.5 g/dL (ref 30.0–36.0)
MCV: 95.2 fL (ref 78.0–100.0)
Monocytes Absolute: 0.8 10*3/uL (ref 0.1–1.0)
Neutro Abs: 3.7 10*3/uL (ref 1.7–7.7)
RDW: 12.8 % (ref 11.5–15.5)

## 2012-07-14 NOTE — ED Provider Notes (Addendum)
History     CSN: [id number]  Arrival date & time 07/14/12  1040   First MD Initiated Contact with Patient 07/14/12 1149      Chief Complaint  Patient presents with  . Extremity Weakness    (Consider location/radiation/quality/duration/timing/severity/associated sxs/prior treatment) HPI Comments: Patient with history of Dementia, a Level 5 caveat therefore applies.    Brought for eval of an episode last night where he was unable to get up out of the chair at dinner.  He said his legs would not hold him.  Shortly thereafter he complained that he was unable to see his wife.  This also seemed to clear up shortly thereafter.  She called the pcp who told them she should come to the ER to rule out a ministroke.    The patient is without complaint today.  Patient is a 78 y.o. male presenting with extremity weakness. The history is provided by the patient and the spouse.  Extremity Weakness This is a new problem. Episode onset: last night. The problem has been resolved. Pertinent negatives include no chest pain, no headaches and no shortness of breath. Nothing aggravates the symptoms. Nothing relieves the symptoms. He has tried nothing for the symptoms.    Past Medical History  Diagnosis Date  . CHF (congestive heart failure)   . Coronary artery disease     s/p CABG  . Ischemic cardiomyopathy   . Colon, diverticulosis   . GERD (gastroesophageal reflux disease)   . BPH (benign prostatic hyperplasia)     Dr. Achilles Dunk  . Heart block     pacer/defib 6/06-----Dr. Graciela Husbands  . AAA (abdominal aortic aneurysm)   . Hyperlipidemia   . Colon cancer 1993  . Hyperplastic colon polyp     Recurrent  . Hernia 2013  . Dementia of the Alzheimer's type     Past Surgical History  Procedure Date  . Coronary artery bypass graft 1997  . Colon resection 1993    R COLON - Cecal cancer  . Cholecystectomy   . Pacemaker insertion 02/1996  . Prostate biopsy 03/2003    negative  . Icd 05/2005    (TYLER) St  Jude Atlas Plus  . Cardiac catheterization   . Insert / replace / remove pacemaker 2006    pacer/defib  (St Jude)  . Cardiolite  12/1999    negative  . Adenosine stress  05/2005    Fixed AMT defect, LV 30% (Dilated)  . Adenosine myoview 11/2006    Low risk  . Coronary angioplasty 12/2006    No significant problems  . Recurrent hyperplastic polyps     Family History  Problem Relation Age of Onset  . Heart disease Mother   . Heart disease Father     History  Substance Use Topics  . Smoking status: Former Smoker -- 1.0 packs/day    Types: Cigarettes    Quit date: 03/13/1996  . Smokeless tobacco: Never Used  . Alcohol Use: Yes     rarely      Review of Systems  Respiratory: Negative for shortness of breath.   Cardiovascular: Negative for chest pain.  Musculoskeletal: Positive for extremity weakness.  Neurological: Negative for headaches.  All other systems reviewed and are negative.    Allergies  Atorvastatin; Latex; Simvastatin; and Adhesive  Home Medications   Current Outpatient Rx  Name Route Sig Dispense Refill  . ASPIRIN 81 MG PO TABS Oral Take 81 mg by mouth daily.      Marland Kitchen CLOPIDOGREL BISULFATE  75 MG PO TABS Oral Take 1 tablet (75 mg total) by mouth daily. 90 tablet 3  . ADULT MULTIVITAMIN W/MINERALS CH Oral Take 1 tablet by mouth daily.    Marland Kitchen NADOLOL 20 MG PO TABS Oral Take 1 tablet (20 mg total) by mouth daily. 30 tablet 6    BP 117/72  Pulse 60  Temp 98.4 F (36.9 C) (Oral)  Resp 18  SpO2 97%  Physical Exam  Nursing note and vitals reviewed. Constitutional: He appears well-developed and well-nourished. No distress.  HENT:  Head: Normocephalic and atraumatic.  Left Ear: External ear normal.  Eyes: EOM are normal. Pupils are equal, round, and reactive to light.  Neck: Normal range of motion. Neck supple.  Cardiovascular: Normal rate and regular rhythm.  Exam reveals no friction rub.   No murmur heard. Pulmonary/Chest: Effort normal and breath  sounds normal. No respiratory distress. He has no wheezes.  Abdominal: Soft. Bowel sounds are normal. He exhibits no distension. There is no tenderness.  Musculoskeletal: Normal range of motion. He exhibits no edema.  Neurological: He is alert. No cranial nerve deficit. He exhibits normal muscle tone. Coordination normal.       He is awake, alert, and appropriate when answering questions.  He does have obvious dementia and does not recall the episode for which he is here.  He is not oriented to situation.    Strength is 5/5 in all extremities.  Skin: Skin is warm and dry. He is not diaphoretic.    ED Course  Procedures (including critical care time)   Labs Reviewed  GLUCOSE, CAPILLARY  CBC WITH DIFFERENTIAL  COMPREHENSIVE METABOLIC PANEL   No results found.   No diagnosis found.   Date: 07/14/2012  Rate: 60  Rhythm: V-Paced  QRS Axis: indeterminate  Intervals: normal  ST/T Wave abnormalities: nonspecific T wave changes  Conduction Disutrbances:none  Narrative Interpretation:   Old EKG Reviewed: unchanged    MDM  The workup is unremarkable.  There is only atrophy on the ct scan and no evidence for stroke.  I will discharged the patient to home as he is now ambulating, and feels fine.  To follow up with pcp next week, return if he worsens.          Geoffery Lyons, MD 07/14/12 1333  Geoffery Lyons, MD 07/14/12 [phone]

## 2012-07-14 NOTE — Telephone Encounter (Signed)
Ms. Boen called this morning to report a number of concerning symptoms last night. Apparently the patient could not get out of a chair last night - his legs were "like water" and he had to have max assist to get to bed. He also complained of visual changes and not being able to see. He was rubbing his pacemaker site so his family thought maybe it was related to this. They decided to see how the night went, but this morning he is still not quite himself. He is walking around today but is still somewhat weak. I advised they proceed to ER ASAP; Ms. Tsan verbalized understanding and gratitude and plans to do so.

## 2012-07-14 NOTE — ED Notes (Signed)
Pt returned from CT. Pt ambulated to restroom with quick, steady gait. Pt in NAD.

## 2012-07-14 NOTE — ED Notes (Signed)
Pt refused swallow screen, stated "I'm ready to get out of here."

## 2012-07-14 NOTE — ED Notes (Signed)
Pt family member states pt had episode last night of being unsteady and having issues with vision. Pt has dementia and family member states it is difficult to get answers from pt. Pt stated to family member this am "last night something wasn't right." Pt currently denies any pain, equal grips, no facial droop, no neuro deficits noted from baseline. Pt able to ambulate with quick, steady gait, and family member states pt appears back to baseline. Family member states pt was unsteady on feet for a while this am, but no longer.

## 2012-07-14 NOTE — ED Notes (Signed)
Pt transported to CT ?

## 2012-07-14 NOTE — ED Notes (Signed)
Family member reports pt has hx dementia, had episode last night lasting approx 20 mins where pt stood up and his legs were not functioning properly and pt was not able to ambulate. It resolved, pt went to bed but then reports changes in vision, pt hard to assess due to dementia. Reports pt has been mildly unstable gait this am. Ambulatory at triage.

## 2012-07-14 NOTE — ED Notes (Signed)
Pt d/c home in NAD. Pt caregiver voiced understanding of d/c instructions and follow up care. Pt ambulated with quick steady gait.

## 2012-07-16 ENCOUNTER — Telehealth: Payer: Self-pay | Admitting: *Deleted

## 2012-07-16 NOTE — Telephone Encounter (Signed)
Patient was really unsteady Friday night, took to ED Saturday morning, went for weakness, did CT scan and blood work and found nothing. Pt made appt on Friday 07/20/12 for f/u.

## 2012-07-17 NOTE — Telephone Encounter (Signed)
okay

## 2012-07-18 ENCOUNTER — Telehealth: Payer: Self-pay | Admitting: Internal Medicine

## 2012-07-18 NOTE — Telephone Encounter (Signed)
New problem;  Per Polly wife calling. Need further clearance before surgery . episode on Saturday went to emergency at [facility] xray shown nothing.   Seen Tereso Newcomer  On 7/29  was told to stay on asa, can come off plavix. Urology saying patient must be off both medication for a week prior to surgery.

## 2012-07-18 NOTE — Telephone Encounter (Signed)
Pt's wife says pt is scheduled for prostate surg in 5-6 weeks. He had surgical clearance appt with Tereso Newcomer, PA last week. He was cleared from surgery from cardiac standpoint and was told ok to hold Plavix prior to surgery.  Wife says pt had a setback this weekend and had to go to ER. He had a spell of weakness and confusion. He went to ER and ruled out for CVA, etc.    Wife says he has appt with Dr. Demaris Callander Friday 8/9. Wife says Dr. Achilles Dunk needs pt to hold BOTH ASA and Plavix prior to surgery and wonders if this is ok with Dr. Graciela Husbands. I explained he is on vacation and will return Monday. She verb. Understanding and says ok to wait until he returns from vacation to get a response.

## 2012-07-20 ENCOUNTER — Encounter: Payer: Self-pay | Admitting: Internal Medicine

## 2012-07-20 ENCOUNTER — Ambulatory Visit (INDEPENDENT_AMBULATORY_CARE_PROVIDER_SITE_OTHER): Payer: Medicare Other | Admitting: Internal Medicine

## 2012-07-20 VITALS — BP 128/70 | HR 72 | Temp 98.6°F | Wt 145.0 lb

## 2012-07-20 DIAGNOSIS — K4090 Unilateral inguinal hernia, without obstruction or gangrene, not specified as recurrent: Secondary | ICD-10-CM

## 2012-07-20 DIAGNOSIS — N400 Enlarged prostate without lower urinary tract symptoms: Secondary | ICD-10-CM

## 2012-07-20 DIAGNOSIS — R29898 Other symptoms and signs involving the musculoskeletal system: Secondary | ICD-10-CM

## 2012-07-20 NOTE — Assessment & Plan Note (Signed)
Has marked urinary retention and is due for procedure Cardiology has stated he needs to continue his aspirin---- even more so if this was a neurologic event (though I am not sure about this) and Dr Achilles Dunk wants him off aspirin for procedure  Will refer to Dr Graciela Husbands to adjudicate this situation Despite some risks off aspirin, he is heading for serious issues if prostate is not taken care of

## 2012-07-20 NOTE — Assessment & Plan Note (Signed)
Pattern not really consistent with TIA No neuro deficits now Still have concerns of stopping all antiplatelet meds for surgery

## 2012-07-20 NOTE — Assessment & Plan Note (Signed)
Decision not to repair this has been made

## 2012-07-20 NOTE — Progress Notes (Signed)
Subjective:    Patient ID: [name] Rich, male    DOB: 03-19-1932, 78 y.o.   MRN: [id number]  HPI Here with wife Had been sitting in chair---couldn't get up and fell back in chair Rested and tried again but still couldn't get up Then able to get up holding to another chair---but was still unstable Then went upstairs requesting to go to bed Then claimed he couldn't see "anything" ---but could see wife He was stating "we have to go somewhere....something isn't right"  Went to ER on advice of [facility]-----  Micah Flesher to ER Blood work, CT scan, and exam all benign  Yesterday he leaned over to tie shoes and then started trembling Looked fearful Sat back and stated "I just moved the wrong way"  Current Outpatient Prescriptions on File Prior to Visit  Medication Sig Dispense Refill  . aspirin 81 MG tablet Take 81 mg by mouth daily.        . clopidogrel (PLAVIX) 75 MG tablet Take 1 tablet (75 mg total) by mouth daily.  90 tablet  3  . Multiple Vitamin (MULTIVITAMIN WITH MINERALS) TABS Take 1 tablet by mouth daily.      . nadolol (CORGARD) 20 MG tablet Take 1 tablet (20 mg total) by mouth daily.  30 tablet  6    Allergies  Allergen Reactions  . Atorvastatin     REACTION: confusion  . Latex Hives  . Simvastatin     REACTION: zombie  . Adhesive (Tape) Rash    Past Medical History  Diagnosis Date  . CHF (congestive heart failure)   . Coronary artery disease     s/p CABG  . Ischemic cardiomyopathy   . Colon, diverticulosis   . GERD (gastroesophageal reflux disease)   . BPH (benign prostatic hyperplasia)     Dr. Achilles Dunk  . Heart block     pacer/defib 6/06-----Dr. Graciela Husbands  . AAA (abdominal aortic aneurysm)   . Hyperlipidemia   . Colon cancer 1993  . Hyperplastic colon polyp     Recurrent  . Hernia 2013  . Dementia of the Alzheimer's type     Past Surgical History  Procedure Date  . Coronary artery bypass graft 1997  . Colon resection 1993    R COLON - Cecal cancer    . Cholecystectomy   . Pacemaker insertion 02/1996  . Prostate biopsy 03/2003    negative  . Icd 05/2005    (TYLER) St Jude Atlas Plus  . Cardiac catheterization   . Insert / replace / remove pacemaker 2006    pacer/defib  (St Jude)  . Cardiolite  12/1999    negative  . Adenosine stress  05/2005    Fixed AMT defect, LV 30% (Dilated)  . Adenosine myoview 11/2006    Low risk  . Coronary angioplasty 12/2006    No significant problems  . Recurrent hyperplastic polyps     Family History  Problem Relation Age of Onset  . Heart disease Mother   . Heart disease Father     History   Social History  . Marital Status: Married    Spouse Name: N/A    Number of Children: 3  . Years of Education: N/A   Occupational History  . retired [employer]   . Previously [employer]    Social History Main Topics  . Smoking status: Former Smoker -- 1.0 packs/day    Types: Cigarettes    Quit date: 03/13/1996  . Smokeless tobacco: Never Used  .  Alcohol Use: Yes     rarely  . Drug Use: No  . Sexually Active: Not on file   Other Topics Concern  . Not on file   Social History Narrative   Married with 2 sons, 1 daughter     Review of Systems Appetite is fine Sleeping okay    Objective:   Physical Exam  Constitutional: He appears well-developed and well-nourished. No distress.  Neurological: He is alert. He has normal strength. He displays no tremor. No cranial nerve deficit. He exhibits normal muscle tone. He displays a negative Romberg sign. Coordination and gait normal.          Assessment & Plan:

## 2012-07-23 NOTE — Telephone Encounter (Signed)
taht would be fine [name] Rich

## 2012-07-24 ENCOUNTER — Encounter: Payer: Self-pay | Admitting: Family Medicine

## 2012-07-24 ENCOUNTER — Ambulatory Visit (INDEPENDENT_AMBULATORY_CARE_PROVIDER_SITE_OTHER): Payer: Medicare Other | Admitting: Family Medicine

## 2012-07-24 VITALS — BP 120/70 | HR 64 | Temp 97.9°F | Wt 145.2 lb

## 2012-07-24 DIAGNOSIS — J069 Acute upper respiratory infection, unspecified: Secondary | ICD-10-CM

## 2012-07-24 MED ORDER — AZITHROMYCIN 250 MG PO TABS: ORAL_TABLET | ORAL | Status: AC

## 2012-07-24 NOTE — Telephone Encounter (Signed)
[name] Rich was notified.

## 2012-07-24 NOTE — Patient Instructions (Signed)
Sounds like you have an upper respiratory infection. Use medication as prescribed: zpack Start allegra for possible allergic component to cough. Take simple mucinex with plenty of water. Push fluids and plenty of rest. Please return if you are not improving as expected, or if you have high fevers (>101.5) or difficulty swallowing or worsening productive cough. Call clinic with questions.  Good to see you today.

## 2012-07-24 NOTE — Assessment & Plan Note (Signed)
Laryngopharyngitis with cough Given comorbidities, treat aggressively with zpack. Update if sxs not improving as expected, discussed in detail sxs concerning for more serious infection ie PNA.

## 2012-07-24 NOTE — Progress Notes (Signed)
  Subjective:    Patient ID: [name] Rich, male    DOB: May 06, 1932, 78 y.o.   MRN: [id number]  HPI CC: cough, chest congestion  H/o dementia, wife helps with story.  Bronchitis this spring s/p 2 abx, seen at ER for this.  Prescribed zpack, improved but cough returned.  Saw Dr. Patsy Lager, placed on another abx (levaquin) as well as cough syrup (phenergan DM) which helped with cough.  Mild dry cough has remained.  Then this morning when awoke, worsening cough, wife thinks productive.  When coughing, states throat hurts.    Has not tried OTC meds.   No HA, chest pain, SOB, fevers/chills.  Wife recently sick with URTI.  No smokers at home.  No h/o asthma/COPD.  Past Medical History  Diagnosis Date  . CHF (congestive heart failure)   . Coronary artery disease     s/p CABG  . Ischemic cardiomyopathy   . Colon, diverticulosis   . GERD (gastroesophageal reflux disease)   . BPH (benign prostatic hyperplasia)     Dr. Achilles Dunk  . Heart block     pacer/defib 6/06-----Dr. Graciela Husbands  . AAA (abdominal aortic aneurysm)   . Hyperlipidemia   . Colon cancer 1993  . Hyperplastic colon polyp     Recurrent  . Hernia 2013  . Dementia of the Alzheimer's type      Review of Systems Per HPI    Objective:   Physical Exam  Nursing note and vitals reviewed. Constitutional: He appears well-developed and well-nourished. No distress.  HENT:  Head: Normocephalic and atraumatic.  Right Ear: Hearing, tympanic membrane, external ear and ear canal normal.  Left Ear: Hearing, tympanic membrane, external ear and ear canal normal.  Nose: Nose normal. No mucosal edema or rhinorrhea. Right sinus exhibits no maxillary sinus tenderness and no frontal sinus tenderness. Left sinus exhibits no maxillary sinus tenderness and no frontal sinus tenderness.  Mouth/Throat: Uvula is midline and mucous membranes are normal. Posterior oropharyngeal erythema present. No oropharyngeal exudate, posterior oropharyngeal edema or  tonsillar abscesses.       R canal with dry cerumen  Eyes: Conjunctivae and EOM are normal. Pupils are equal, round, and reactive to light. No scleral icterus.  Neck: Normal range of motion. Neck supple.  Cardiovascular: Normal rate, regular rhythm, normal heart sounds and intact distal pulses.   No murmur heard. Pulmonary/Chest: Effort normal and breath sounds normal. No respiratory distress. He has no wheezes. He has no rales.  Lymphadenopathy:    He has no cervical adenopathy.  Skin: Skin is warm and dry. No rash noted.       Assessment & Plan:

## 2012-07-30 ENCOUNTER — Telehealth (INDEPENDENT_AMBULATORY_CARE_PROVIDER_SITE_OTHER): Payer: Self-pay | Admitting: General Surgery

## 2012-07-30 NOTE — Telephone Encounter (Signed)
Spoke with pt's wife and let her know that we needed to reschedule her husbands appt from 8/29 to 8/28 at 1:50.  They were fine with this.

## 2012-08-03 ENCOUNTER — Other Ambulatory Visit: Payer: Self-pay | Admitting: Internal Medicine

## 2012-08-03 MED ORDER — CLOPIDOGREL BISULFATE 75 MG PO TABS: 75.0000 mg | ORAL_TABLET | Freq: Every day | ORAL | Status: DC

## 2012-08-03 NOTE — Telephone Encounter (Signed)
Refill for plavix sent to [facility] for 90 day supply.

## 2012-08-03 NOTE — Telephone Encounter (Signed)
Please send to [facility] for a 90 day supply

## 2012-08-06 ENCOUNTER — Telehealth: Payer: Self-pay | Admitting: *Deleted

## 2012-08-06 MED ORDER — HYDROCODONE-HOMATROPINE 5-1.5 MG/5ML PO SYRP: 5.0000 mL | ORAL_SOLUTION | Freq: Three times a day (TID) | ORAL | Status: DC | PRN

## 2012-08-06 NOTE — Telephone Encounter (Signed)
Message left notifying patient. Called in hycodan to [facility] on [address].

## 2012-08-06 NOTE — Telephone Encounter (Signed)
Patient was seen 2 weeks ago and was given medication for an URI and he finished the z-pak a week ago. Patient still has the cough which is not productive and no fever. Patient is scheduled for prostate surgery next Tuesday and they don't know what to do. Please advise.  Ph[facility]acy [facility] [address].

## 2012-08-06 NOTE — Telephone Encounter (Signed)
If non productive cough, no fever, may be post infectious cough.  Recommend cough syrup and monitor for worsening sxs. May phone in hycodan cough syrup if desired.  Placed in order section. But warn about sedation as it has hydrocodone in it (narcotic).

## 2012-08-08 ENCOUNTER — Ambulatory Visit (INDEPENDENT_AMBULATORY_CARE_PROVIDER_SITE_OTHER)
Admission: RE | Admit: 2012-08-08 | Discharge: 2012-08-08 | Disposition: A | Discharge status: Home / Self Care | Payer: Medicare Other | Source: Ambulatory Visit | Attending: Internal Medicine | Admitting: Internal Medicine

## 2012-08-08 ENCOUNTER — Encounter: Payer: Self-pay | Admitting: Internal Medicine

## 2012-08-08 ENCOUNTER — Encounter (INDEPENDENT_AMBULATORY_CARE_PROVIDER_SITE_OTHER): Payer: Medicare Other | Admitting: General Surgery

## 2012-08-08 ENCOUNTER — Ambulatory Visit (INDEPENDENT_AMBULATORY_CARE_PROVIDER_SITE_OTHER): Payer: Medicare Other | Admitting: Internal Medicine

## 2012-08-08 VITALS — BP 120/60 | HR 68 | Temp 98.2°F | Resp 15 | Wt 147.0 lb

## 2012-08-08 DIAGNOSIS — J019 Acute sinusitis, unspecified: Secondary | ICD-10-CM | POA: Insufficient documentation

## 2012-08-08 DIAGNOSIS — R05 Cough: Secondary | ICD-10-CM

## 2012-08-08 MED ORDER — AMOXICILLIN-POT CLAVULANATE 875-125 MG PO TABS: 1.0000 | ORAL_TABLET | Freq: Two times a day (BID) | ORAL | Status: AC

## 2012-08-08 NOTE — Assessment & Plan Note (Signed)
Some better with azithromycin but worsened CXR looks fine No evidence of chest infection Should be okay to proceed with planned prostate procedure on 08/14/12

## 2012-08-08 NOTE — Assessment & Plan Note (Signed)
Has inflammation on right side Not able to tell PND but this is likely Will treat with augmentin for presumed ongoing sinusitis---esp in view of upcoming surgery

## 2012-08-08 NOTE — Progress Notes (Signed)
Subjective:    Patient ID: [name] Rich, male    DOB: September 27, 1932, 78 y.o.   MRN: [id number]  HPI Finished z-pak almost 2 weeks ago Still having cough---improved with Rx then returned No fever No SOB Feels cool most of the time---this is not new  Cough is productive---wife hears mucus but he must be swallowing it No sore throat--but has been raspy in voice No ear pain No sig nasal symptoms  Current Outpatient Prescriptions on File Prior to Visit  Medication Sig Dispense Refill  . aspirin 81 MG tablet Take 81 mg by mouth daily.        . clopidogrel (PLAVIX) 75 MG tablet Take 1 tablet (75 mg total) by mouth daily.  90 tablet  3  . Multiple Vitamin (MULTIVITAMIN WITH MINERALS) TABS Take 1 tablet by mouth daily.      . nadolol (CORGARD) 20 MG tablet Take 1 tablet (20 mg total) by mouth daily.  30 tablet  6    Allergies  Allergen Reactions  . Allegra (Fexofenadine Hcl) Other (See Comments)    nausea  . Atorvastatin     REACTION: confusion  . Latex Hives  . Simvastatin     REACTION: zombie  . Adhesive (Tape) Rash    Past Medical History  Diagnosis Date  . CHF (congestive heart failure)   . Coronary artery disease     s/p CABG  . Ischemic cardiomyopathy   . Colon, diverticulosis   . GERD (gastroesophageal reflux disease)   . BPH (benign prostatic hyperplasia)     Dr. Achilles Dunk  . Heart block     pacer/defib 6/06-----Dr. Graciela Husbands  . AAA (abdominal aortic aneurysm)   . Hyperlipidemia   . Colon cancer 1993  . Hyperplastic colon polyp     Recurrent  . Hernia 2013  . Dementia of the Alzheimer's type     Past Surgical History  Procedure Date  . Coronary artery bypass graft 1997  . Colon resection 1993    R COLON - Cecal cancer  . Cholecystectomy   . Pacemaker insertion 02/1996  . Prostate biopsy 03/2003    negative  . Icd 05/2005    (TYLER) St Jude Atlas Plus  . Cardiac catheterization   . Insert / replace / remove pacemaker 2006    pacer/defib  (St Jude)  .  Cardiolite  12/1999    negative  . Adenosine stress  05/2005    Fixed AMT defect, LV 30% (Dilated)  . Adenosine myoview 11/2006    Low risk  . Coronary angioplasty 12/2006    No significant problems  . Recurrent hyperplastic polyps     Family History  Problem Relation Age of Onset  . Heart disease Mother   . Heart disease Father     History   Social History  . Marital Status: Married    Spouse Name: N/A    Number of Children: 3  . Years of Education: N/A   Occupational History  . retired [employer]   . Previously [employer]    Social History Main Topics  . Smoking status: Former Smoker -- 1.0 packs/day    Types: Cigarettes    Quit date: 03/13/1996  . Smokeless tobacco: Never Used  . Alcohol Use: Yes     rarely  . Drug Use: No  . Sexually Active: Not on file   Other Topics Concern  . Not on file   Social History Narrative   Married with 2 sons, 1 daughter  Review of Systems No nausea or vomiting Eating okay Due for prostate procedure on Tuesday    Objective:   Physical Exam  Constitutional: He appears well-developed. No distress.  HENT:  Mouth/Throat: Oropharynx is clear and moist. No oropharyngeal exudate.       TMs normal Moderate inflammation and marked swelling in right nare No sinus tenderness  Neck: Normal range of motion. Neck supple. No thyromegaly present.  Cardiovascular: Normal rate, regular rhythm and normal heart sounds.  Exam reveals no gallop.   No murmur heard. Pulmonary/Chest: Effort normal and breath sounds normal. No respiratory distress. He has no wheezes. He has no rales.       No dullness  Musculoskeletal: He exhibits no edema.  Lymphadenopathy:    He has no cervical adenopathy.          Assessment & Plan:

## 2012-08-09 ENCOUNTER — Encounter (INDEPENDENT_AMBULATORY_CARE_PROVIDER_SITE_OTHER): Payer: Medicare Other | Admitting: General Surgery

## 2012-08-12 HISTORY — PX: TRANSURETHRAL RESECTION OF PROSTATE: SHX73

## 2012-08-14 ENCOUNTER — Ambulatory Visit: Payer: Self-pay | Admitting: Urology

## 2012-08-15 LAB — CBC WITH DIFFERENTIAL/PLATELET
Basophil #: 0 10*3/uL (ref 0.0–0.1)
Eosinophil #: 0.2 10*3/uL (ref 0.0–0.7)
HCT: 37.1 % — ABNORMAL LOW (ref 40.0–52.0)
Lymphocyte #: 1.1 10*3/uL (ref 1.0–3.6)
Lymphocyte %: 12.8 %
MCHC: 34.9 g/dL (ref 32.0–36.0)
MCV: 98 fL (ref 80–100)
Monocyte #: 0.9 x10 3/mm (ref 0.2–1.0)
Neutrophil #: 6.3 10*3/uL (ref 1.4–6.5)
RDW: 13.2 % (ref 11.5–14.5)

## 2012-08-15 LAB — BASIC METABOLIC PANEL
Calcium, Total: 8.5 mg/dL (ref 8.5–10.1)
Co2: 29 mmol/L (ref 21–32)
EGFR (African American): >60
EGFR (Non-African Amer.): >60
Osmolality: 270 (ref 275–301)

## 2012-08-15 LAB — MAGNESIUM: Magnesium: 1.8 mg/dL

## 2012-08-17 LAB — PATHOLOGY REPORT

## 2012-09-03 ENCOUNTER — Other Ambulatory Visit: Payer: Self-pay

## 2012-09-03 DIAGNOSIS — I714 Abdominal aortic aneurysm, without rupture: Secondary | ICD-10-CM

## 2012-09-04 ENCOUNTER — Encounter: Payer: Self-pay | Admitting: Internal Medicine

## 2012-09-11 ENCOUNTER — Encounter (INDEPENDENT_AMBULATORY_CARE_PROVIDER_SITE_OTHER): Payer: Medicare Other | Admitting: General Surgery

## 2012-09-12 ENCOUNTER — Other Ambulatory Visit: Payer: Self-pay | Admitting: Internal Medicine

## 2012-09-17 ENCOUNTER — Inpatient Hospital Stay: Payer: Self-pay | Admitting: Internal Medicine

## 2012-09-17 LAB — CBC WITH DIFFERENTIAL/PLATELET
Basophil #: 0 10*3/uL (ref 0.0–0.1)
Eosinophil #: 0.1 10*3/uL (ref 0.0–0.7)
HCT: 30 % — ABNORMAL LOW (ref 40.0–52.0)
Lymphocyte %: 13.7 %
MCHC: 34.5 g/dL (ref 32.0–36.0)
Neutrophil #: 5 10*3/uL (ref 1.4–6.5)
Neutrophil %: 77.9 %
RDW: 13.2 % (ref 11.5–14.5)

## 2012-09-17 LAB — COMPREHENSIVE METABOLIC PANEL
Albumin: 2.9 g/dL — ABNORMAL LOW (ref 3.4–5.0)
Anion Gap: 9 (ref 7–16)
BUN: 28 mg/dL — ABNORMAL HIGH (ref 7–18)
Glucose: 204 mg/dL — ABNORMAL HIGH (ref 65–99)
Osmolality: 287 (ref 275–301)
Potassium: 4.2 mmol/L (ref 3.5–5.1)
Sodium: 138 mmol/L (ref 136–145)
Total Protein: 5.8 g/dL — ABNORMAL LOW (ref 6.4–8.2)

## 2012-09-17 LAB — PROTIME-INR: INR: 1

## 2012-09-18 DIAGNOSIS — I2510 Atherosclerotic heart disease of native coronary artery without angina pectoris: Secondary | ICD-10-CM

## 2012-09-18 LAB — BASIC METABOLIC PANEL
Anion Gap: 8 (ref 7–16)
BUN: 15 mg/dL (ref 7–18)
Chloride: 110 mmol/L — ABNORMAL HIGH (ref 98–107)
Co2: 24 mmol/L (ref 21–32)
Osmolality: 284 (ref 275–301)

## 2012-09-18 LAB — HEMOGLOBIN: HGB: 9.1 g/dL — ABNORMAL LOW (ref 13.0–18.0)

## 2012-09-19 LAB — HEMOGLOBIN: HGB: 8.7 g/dL — ABNORMAL LOW (ref 13.0–18.0)

## 2012-09-27 ENCOUNTER — Encounter: Payer: Self-pay | Admitting: *Deleted

## 2012-09-28 ENCOUNTER — Encounter: Payer: Self-pay | Admitting: Internal Medicine

## 2012-09-28 ENCOUNTER — Ambulatory Visit (INDEPENDENT_AMBULATORY_CARE_PROVIDER_SITE_OTHER): Payer: Medicare Other | Admitting: Internal Medicine

## 2012-09-28 VITALS — BP 138/68 | HR 69 | Temp 98.5°F | Ht 70.0 in | Wt 147.0 lb

## 2012-09-28 DIAGNOSIS — K922 Gastrointestinal hemorrhage, unspecified: Secondary | ICD-10-CM | POA: Insufficient documentation

## 2012-09-28 LAB — CBC WITH DIFFERENTIAL/PLATELET
Basophils Absolute: 0 10*3/uL (ref 0.0–0.1)
Basophils Relative: 0.2 % (ref 0.0–3.0)
Eosinophils Absolute: 0.2 10*3/uL (ref 0.0–0.7)
HCT: 29.9 % — ABNORMAL LOW (ref 39.0–52.0)
Hemoglobin: 9.8 g/dL — ABNORMAL LOW (ref 13.0–17.0)
Lymphs Abs: 1.1 10*3/uL (ref 0.7–4.0)
MCHC: 32.8 g/dL (ref 30.0–36.0)
MCV: 100.8 fl — ABNORMAL HIGH (ref 78.0–100.0)
Monocytes Absolute: 0.9 10*3/uL (ref 0.1–1.0)
Neutro Abs: 4.7 10*3/uL (ref 1.4–7.7)
RBC: 2.96 Mil/uL — ABNORMAL LOW (ref 4.22–5.81)
RDW: 14.7 % — ABNORMAL HIGH (ref 11.5–14.6)

## 2012-09-28 NOTE — Progress Notes (Signed)
Subjective:    Patient ID: [name] Rich, male    DOB: 10-Feb-1932, 78 y.o.   MRN: [id number]  HPI Here with wife  Had rectal bleeding Bright red blood Hospitalized at [facility] for 3 days No bleeding since coming home  Appetite is fine Bowels have been fine No nausea or vomiting Wife notes he occ has some right flank pain---non specific  Current Outpatient Prescriptions on File Prior to Visit  Medication Sig Dispense Refill  . Multiple Vitamin (MULTIVITAMIN WITH MINERALS) TABS Take 1 tablet by mouth daily.      . nadolol (CORGARD) 20 MG tablet TAKE 1 TABLET DAILY  30 tablet  1    Allergies  Allergen Reactions  . Allegra (Fexofenadine Hcl) Other (See Comments)    nausea  . Atorvastatin     REACTION: confusion  . Latex Hives  . Simvastatin     REACTION: zombie  . Adhesive (Tape) Rash    Past Medical History  Diagnosis Date  . CHF (congestive heart failure)   . Coronary artery disease     s/p CABG  . Ischemic cardiomyopathy   . Colon, diverticulosis   . GERD (gastroesophageal reflux disease)   . BPH (benign prostatic hyperplasia)     Dr. Achilles Dunk  . Heart block     pacer/defib 6/06-----Dr. Graciela Husbands  . AAA (abdominal aortic aneurysm)   . Hyperlipidemia   . Colon cancer 1993  . Hyperplastic colon polyp     Recurrent  . Hernia 2013  . Dementia of the Alzheimer's type   . GI bleed   . Diverticulosis   . Internal hemorrhoids     Past Surgical History  Procedure Date  . Coronary artery bypass graft 1997  . Colon resection 1993    R COLON - Cecal cancer  . Cholecystectomy   . Pacemaker insertion 02/1996  . Prostate biopsy 03/2003    negative  . Icd 05/2005    (TYLER) St Jude Atlas Plus  . Cardiac catheterization   . Insert / replace / remove pacemaker 2006    pacer/defib  (St Jude)  . Cardiolite  12/1999    negative  . Adenosine stress  05/2005    Fixed AMT defect, LV 30% (Dilated)  . Adenosine myoview 11/2006    Low risk  . Coronary angioplasty 12/2006    No  significant problems  . Recurrent hyperplastic polyps   . Transurethral resection of prostate 9/13    Dr Achilles Dunk    Family History  Problem Relation Age of Onset  . Heart disease Mother   . Heart disease Father     History   Social History  . Marital Status: Married    Spouse Name: N/A    Number of Children: 3  . Years of Education: N/A   Occupational History  . retired [employer]   . Previously [employer]    Social History Main Topics  . Smoking status: Former Smoker -- 1.0 packs/day    Types: Cigarettes    Quit date: 03/13/1996  . Smokeless tobacco: Never Used  . Alcohol Use: Yes     rarely  . Drug Use: No  . Sexually Active: Not on file   Other Topics Concern  . Not on file   Social History Narrative   Married with 2 sons, 1 daughter      Review of Systems Voiding okay--retention is much better now Weight seems stable    Objective:   Physical Exam  Constitutional: He appears well-developed  and well-nourished. No distress.  Abdominal: Soft. Bowel sounds are normal. He exhibits no distension. There is no tenderness. There is no rebound and no guarding.          Assessment & Plan:

## 2012-09-28 NOTE — Assessment & Plan Note (Addendum)
Presumed diverticular bleed Seems to have resolved Will just check CBC Off plavix for good---will restart aspirin if cleared by Dr Jarold Motto

## 2012-10-02 ENCOUNTER — Encounter: Payer: Self-pay | Admitting: Gastroenterology

## 2012-10-02 ENCOUNTER — Ambulatory Visit (INDEPENDENT_AMBULATORY_CARE_PROVIDER_SITE_OTHER): Payer: Medicare Other | Admitting: Gastroenterology

## 2012-10-02 VITALS — BP 130/70 | HR 60 | Ht 70.0 in | Wt 148.8 lb

## 2012-10-02 DIAGNOSIS — K625 Hemorrhage of anus and rectum: Secondary | ICD-10-CM

## 2012-10-02 DIAGNOSIS — K5730 Diverticulosis of large intestine without perforation or abscess without bleeding: Secondary | ICD-10-CM

## 2012-10-02 DIAGNOSIS — Z7901 Long term (current) use of anticoagulants: Secondary | ICD-10-CM

## 2012-10-02 DIAGNOSIS — F0390 Unspecified dementia without behavioral disturbance: Secondary | ICD-10-CM

## 2012-10-02 NOTE — Patient Instructions (Addendum)
Resume Asprin Follow up as needed

## 2012-10-02 NOTE — Progress Notes (Signed)
History of Present Illness:  This is a extremely complex and demented 78 year old Caucasian male recently admitted to [facility] with diverticular hemorrhage. His aspirin and Plavix were discontinued, and he is been asymptomatic for the last 2 weeks. He had a similar episode 2 weeks ago. Was apparently seen by cardiology during his recent hospitalization., and their decision was not to restart Plavix but to continue aspirin. The patient did have a CT scan of his abdomen that was reviewed from August of this year that was unremarkable. This was done because of prostate abnormalities and difficulties before TURP surgery.. He had right hemicolectomy in 1993 because of colon cancer, and had a negative colonoscopy in 2012 except for diverticulosis . Internal hemorrhoids were also noted. There is no evidence of active bleeding at that time or significant polyposis. The patient's wife is with him during this exam and interview today, and relates he currently is having regular bowel movements without abdominal pain or rectal bleeding. His appetite is good and his weight is stable. In addition to his dementia he has multiple cardiovascular issues as listed and reviewed his record. Patient is also status post cholecystectomy.  I have reviewed this patient's present history, medical and surgical past history, allergies and medications.     ROS: The remainder of the 10 point ROS is negative... severe advancing dementia. Review of the patient's labs shows a normal CBC on August 3 before he was hospitalized with acute GI bleeding. He carries a diagnosis of ischemic cardiomyopathy with previous pacemaker placement. He also has BPH with previous TURP in September of this year. He has a history of gastroesophageal reflux disease but apparently is not on acid suppressive medications at this time. He does take Macrodantin 100 mg at bedtime regularly.  Allergies  Allergen Reactions  . Allegra (Fexofenadine  Hcl) Other (See Comments)    nausea  . Atorvastatin     REACTION: confusion  . Latex Hives  . Simvastatin     REACTION: zombie  . Adhesive (Tape) Rash   Outpatient Prescriptions Prior to Visit  Medication Sig Dispense Refill  . Multiple Vitamin (MULTIVITAMIN WITH MINERALS) TABS Take 1 tablet by mouth daily.      . nadolol (CORGARD) 20 MG tablet TAKE 1 TABLET DAILY  30 tablet  1   Past Medical History  Diagnosis Date  . CHF (congestive heart failure)   . Coronary artery disease     s/p CABG  . Ischemic cardiomyopathy   . Colon, diverticulosis   . GERD (gastroesophageal reflux disease)   . BPH (benign prostatic hyperplasia)     Dr. Achilles Dunk  . Heart block     pacer/defib 6/06-----Dr. Graciela Husbands  . AAA (abdominal aortic aneurysm)   . Hyperlipidemia   . Colon cancer 1993  . Hyperplastic colon polyp     Recurrent  . Hernia 2013    left  . Dementia of the Alzheimer's type   . GI bleed   . Diverticulosis   . Internal hemorrhoids    Past Surgical History  Procedure Date  . Coronary artery bypass graft 1997  . Colon resection 1993    R COLON - Cecal cancer  . Cholecystectomy   . Pacemaker insertion 02/1996  . Prostate biopsy 03/2003    negative  . Icd 05/2005    (TYLER) St Jude Atlas Plus  . Cardiac catheterization   . Insert / replace / remove pacemaker 2006,2013    pacer/defib  (St Jude), defib removed 2013  .  Cardiolite  12/1999    negative  . Adenosine stress  05/2005    Fixed AMT defect, LV 30% (Dilated)  . Adenosine myoview 11/2006    Low risk  . Coronary angioplasty 12/2006    No significant problems  . Recurrent hyperplastic polyps   . Transurethral resection of prostate 9/13    Dr Achilles Dunk   History   Social History  . Marital Status: Married    Spouse Name: N/A    Number of Children: 3  . Years of Education: N/A   Occupational History  . retired [employer]   . Previously [employer]    Social History Main Topics  . Smoking status: Former Smoker -- 1.0  packs/day    Types: Cigarettes    Quit date: 03/13/1996  . Smokeless tobacco: Never Used  . Alcohol Use: Yes     rarely  . Drug Use: No  . Sexually Active: None   Other Topics Concern  . None   Social History Narrative   Married with 2 sons, 1 daughter   Family History  Problem Relation Age of Onset  . Heart disease Mother   . Heart disease Father   . Colon cancer      pat cousins        Physical Exam: Demented patient in no acute distress. Blood pressure 130/70, pulse 60 and regular, and weight 148 pounds a BMI of 21.35. The patient is talkative and conversive but is disoriented to time, and cannot give an adequate history that was reported by his wife. General well developed well nourished patient in no acute distress, appearing their stated age Chest clear to percussion and auscultation Heart no significant murmurs, gallops or rubs noted Abdomen no hepatosplenomegaly masses or tenderness, BS normal.  Rectal inspection normal no fissures, or fistulae noted.  No masses or tenderness on digital exam. Stool guaiac negative. Extremities no acute joint lesions, edema, phlebitis or evidence of cellulitis. Neurologic patient oriented x 3, cranial nerves intact, no focal neurologic deficits noted.   Assessment and plan: Status post diverticular hemorrhage while on Plavix and aspirin. This also occurred 2 years ago, and in the intervening time he has had a negative colonoscopy. I see no reason why the patient could not resume aspirin at this time. He should continue a high fiber diet as tolerated. I certainly do not think he needs followup colonoscopy or barium enema exam at this time. As mentioned above he also had CT scan of the abdomen recently which was otherwise unremarkable. His CBC will be followed by primary care in [place] as previously planned. He otherwise his continue his medications as listed and reviewed.  Please copy her primary care physician, referring physician,  and pertinent subspecialists.  No diagnosis found.

## 2012-10-09 ENCOUNTER — Encounter (INDEPENDENT_AMBULATORY_CARE_PROVIDER_SITE_OTHER): Payer: Medicare Other | Admitting: General Surgery

## 2012-10-15 ENCOUNTER — Ambulatory Visit: Payer: Medicare Other | Admitting: Internal Medicine

## 2012-10-22 ENCOUNTER — Encounter (INDEPENDENT_AMBULATORY_CARE_PROVIDER_SITE_OTHER): Payer: Medicare Other | Admitting: General Surgery

## 2012-10-23 ENCOUNTER — Encounter (INDEPENDENT_AMBULATORY_CARE_PROVIDER_SITE_OTHER): Payer: Medicare Other | Admitting: General Surgery

## 2012-10-24 ENCOUNTER — Ambulatory Visit (INDEPENDENT_AMBULATORY_CARE_PROVIDER_SITE_OTHER): Payer: Medicare Other | Admitting: Internal Medicine

## 2012-10-24 ENCOUNTER — Encounter: Payer: Self-pay | Admitting: Internal Medicine

## 2012-10-24 VITALS — BP 120/70 | HR 81 | Temp 97.7°F | Wt 146.0 lb

## 2012-10-24 DIAGNOSIS — K922 Gastrointestinal hemorrhage, unspecified: Secondary | ICD-10-CM

## 2012-10-24 DIAGNOSIS — N400 Enlarged prostate without lower urinary tract symptoms: Secondary | ICD-10-CM

## 2012-10-24 DIAGNOSIS — F0280 Dementia in other diseases classified elsewhere without behavioral disturbance: Secondary | ICD-10-CM

## 2012-10-24 DIAGNOSIS — K4090 Unilateral inguinal hernia, without obstruction or gangrene, not specified as recurrent: Secondary | ICD-10-CM

## 2012-10-24 LAB — CBC WITH DIFFERENTIAL/PLATELET
Basophils Absolute: 0 10*3/uL (ref 0.0–0.1)
Eosinophils Absolute: 0.1 10*3/uL (ref 0.0–0.7)
Hemoglobin: 12.6 g/dL — ABNORMAL LOW (ref 13.0–17.0)
Lymphocytes Relative: 17.7 % (ref 12.0–46.0)
MCHC: 32.8 g/dL (ref 30.0–36.0)
Monocytes Relative: 13 % — ABNORMAL HIGH (ref 3.0–12.0)
Neutro Abs: 4.2 10*3/uL (ref 1.4–7.7)
Platelets: 183 10*3/uL (ref 150.0–400.0)
RDW: 13.9 % (ref 11.5–14.6)

## 2012-10-24 MED ORDER — ALPRAZOLAM 0.25 MG PO TABS: 0.1250 mg | ORAL_TABLET | Freq: Three times a day (TID) | ORAL | Status: DC | PRN

## 2012-10-24 NOTE — Assessment & Plan Note (Signed)
Seems to have resolved Probably diverticular Will recheck CBC

## 2012-10-24 NOTE — Assessment & Plan Note (Signed)
Better since procedure Due for follow up with Dr Achilles Dunk

## 2012-10-24 NOTE — Assessment & Plan Note (Signed)
Will continue to just observe and use truss

## 2012-10-24 NOTE — Assessment & Plan Note (Signed)
Fairly stable function Wife notes agitation with changes Will try low dose alprazolam

## 2012-10-24 NOTE — Progress Notes (Signed)
Subjective:    Patient ID: [name] Rich, male    DOB: 09-03-1932, 78 y.o.   MRN: [id number]  HPI Doing okay Here with wife No further GI bleeding  Is back on asa  Has had some fatigue look, per wife Wonders if it is the anemia Appetite is good  Voiding well No obvious problems with retention Has follow up with Dr Achilles Dunk coming up  Hernia will pop out if he exerts himself too much--like leaf blowing Using truss No pain with this  Memory about the same Good days and bad days Independent in personal care Still helps with house and yard work  Does get riled up with any changes in routine---like new aide Wife wonders about minor tranquilizer for this  Current Outpatient Prescriptions on File Prior to Visit  Medication Sig Dispense Refill  . Multiple Vitamin (MULTIVITAMIN WITH MINERALS) TABS Take 1 tablet by mouth daily.      . nadolol (CORGARD) 20 MG tablet TAKE 1 TABLET DAILY  30 tablet  1  . nitrofurantoin (MACRODANTIN) 100 MG capsule Take 100 mg by mouth at bedtime.        Allergies  Allergen Reactions  . Allegra (Fexofenadine Hcl) Other (See Comments)    nausea  . Atorvastatin     REACTION: confusion  . Latex Hives  . Simvastatin     REACTION: zombie  . Adhesive (Tape) Rash    Past Medical History  Diagnosis Date  . CHF (congestive heart failure)   . Coronary artery disease     s/p CABG  . Ischemic cardiomyopathy   . Colon, diverticulosis   . GERD (gastroesophageal reflux disease)   . BPH (benign prostatic hyperplasia)     Dr. Achilles Dunk  . Heart block     pacer/defib 6/06-----Dr. Graciela Husbands  . AAA (abdominal aortic aneurysm)   . Hyperlipidemia   . Colon cancer 1993  . Hyperplastic colon polyp     Recurrent  . Hernia 2013    left  . Dementia of the Alzheimer's type   . GI bleed   . Diverticulosis   . Internal hemorrhoids     Past Surgical History  Procedure Date  . Coronary artery bypass graft 1997  . Colon resection 1993    R COLON - Cecal cancer    . Cholecystectomy   . Pacemaker insertion 02/1996  . Prostate biopsy 03/2003    negative  . Icd 05/2005    (TYLER) St Jude Atlas Plus  . Cardiac catheterization   . Insert / replace / remove pacemaker 2006,2013    pacer/defib  (St Jude), defib removed 2013  . Cardiolite  12/1999    negative  . Adenosine stress  05/2005    Fixed AMT defect, LV 30% (Dilated)  . Adenosine myoview 11/2006    Low risk  . Coronary angioplasty 12/2006    No significant problems  . Recurrent hyperplastic polyps   . Transurethral resection of prostate 9/13    Dr Achilles Dunk    Family History  Problem Relation Age of Onset  . Heart disease Mother   . Heart disease Father   . Colon cancer      pat cousins    History   Social History  . Marital Status: Married    Spouse Name: N/A    Number of Children: 3  . Years of Education: N/A   Occupational History  . retired [employer]   . Previously [employer]    Social History Main Topics  .  Smoking status: Former Smoker -- 1.0 packs/day    Types: Cigarettes    Quit date: 03/13/1996  . Smokeless tobacco: Never Used  . Alcohol Use: Yes     Comment: rarely  . Drug Use: No  . Sexually Active: Not on file   Other Topics Concern  . Not on file   Social History Narrative   Married with 2 sons, 1 daughter   Review of Systems Weight is down 2# Sleeps well---awakens refreshed    Objective:   Physical Exam  Constitutional: He appears well-developed and well-nourished. No distress.  Neck: Normal range of motion. Neck supple.  Cardiovascular: Normal rate, regular rhythm, normal heart sounds and intact distal pulses.  Exam reveals no gallop.   No murmur heard. Pulmonary/Chest: Effort normal and breath sounds normal. No respiratory distress. He has no wheezes. He has no rales.  Abdominal: Soft. There is no tenderness. There is no rebound and no guarding.  Musculoskeletal: He exhibits no edema and no tenderness.  Lymphadenopathy:    He has no cervical  adenopathy.  Neurological:       Trouble processing requests---like when I asked him to pull his shirt out--he had trouble understanding what that meant  Psychiatric: He has a normal mood and affect. His behavior is normal.          Assessment & Plan:

## 2012-10-25 ENCOUNTER — Encounter: Payer: Self-pay | Admitting: *Deleted

## 2012-11-15 ENCOUNTER — Other Ambulatory Visit: Payer: Self-pay | Admitting: Internal Medicine

## 2012-11-15 NOTE — Telephone Encounter (Signed)
Refill sent for nadolol.

## 2012-11-27 ENCOUNTER — Telehealth: Payer: Self-pay | Admitting: *Deleted

## 2012-11-27 NOTE — Telephone Encounter (Signed)
Spoke with wife and she doesn't know what facility he will be going to, but she does agree to an appt because she has questions. Per wife she has appt on 12/21/11 with [facility]. Wife scheduled appt 12/19/11.

## 2012-11-27 NOTE — Telephone Encounter (Signed)
Wife calling asking that Dr.Letvak fill out a FL-2 form for the patient.  Per verbal from Dr.Letvak, pt would need visit to fill out form face-to-face, need to know where he's going, what needs he would have and if there is a change in status.  Left message with male that answered the phone, stated wife was out shopping and she would have her call back later.

## 2012-11-28 NOTE — Telephone Encounter (Signed)
Will need to review ADL assist that he needs and confirm that plans are for AL, not SNF

## 2012-11-30 ENCOUNTER — Ambulatory Visit (INDEPENDENT_AMBULATORY_CARE_PROVIDER_SITE_OTHER): Payer: Medicare Other | Admitting: General Surgery

## 2012-11-30 ENCOUNTER — Encounter (INDEPENDENT_AMBULATORY_CARE_PROVIDER_SITE_OTHER): Payer: Self-pay | Admitting: General Surgery

## 2012-11-30 VITALS — BP 130/78 | HR 76 | Temp 97.8°F | Resp 18 | Ht 70.0 in | Wt 147.5 lb

## 2012-11-30 DIAGNOSIS — K4090 Unilateral inguinal hernia, without obstruction or gangrene, not specified as recurrent: Secondary | ICD-10-CM

## 2012-11-30 NOTE — Patient Instructions (Signed)
Continue to use truss and check position on a regular basis

## 2012-12-03 ENCOUNTER — Encounter (INDEPENDENT_AMBULATORY_CARE_PROVIDER_SITE_OTHER): Payer: Self-pay | Admitting: General Surgery

## 2012-12-03 NOTE — Progress Notes (Signed)
Subjective:     Patient ID: [name] Rich, male   DOB: [birth year], 78 y.o.   MRN: [id number]  HPI The patient is a 78 year old white male with significant dementia who we have been following for a left inguinal hernia. He has been wearing a truss and this is done well for him. He did have an episode recently with some abdominal pain but this has since resolved. He feels well today. He denies any nausea or vomiting.  Review of Systems  Constitutional: Negative.   HENT: Negative.   Eyes: Negative.   Respiratory: Negative.   Cardiovascular: Negative.   Gastrointestinal: Negative.   Genitourinary: Negative.   Musculoskeletal: Negative.   Skin: Negative.   Neurological: Negative.   Hematological: Negative.   Psychiatric/Behavioral: Positive for confusion.       Objective:   Physical Exam  Constitutional: He appears well-developed and well-nourished.  HENT:  Head: Normocephalic and atraumatic.  Eyes: Conjunctivae normal and EOM are normal. Pupils are equal, round, and reactive to light.  Neck: Normal range of motion. Neck supple.  Cardiovascular: Normal rate, regular rhythm and normal heart sounds.   Pulmonary/Chest: Effort normal and breath sounds normal.  Abdominal: Soft. Bowel sounds are normal.  Genitourinary:       He has no palpable bulge at this moment in either groin. His truss is improperly adjusted.  Musculoskeletal: Normal range of motion.  Neurological: He is alert.  Skin: Skin is warm and dry.  Psychiatric: He has a normal mood and affect. His behavior is normal.       Assessment:     The patient has a left inguinal hernia that is being managed very effectively by the truss. It was maladjustment today and we were able to get fitting better. I instructed the family on how to make sure that the truss that's properly.    Plan:     As long as he is asymptomatic we will continue to follow him closely and he will use the truss every day. We will plan to see him back  in 6 months.

## 2012-12-06 ENCOUNTER — Other Ambulatory Visit: Payer: Self-pay | Admitting: Internal Medicine

## 2012-12-06 NOTE — Telephone Encounter (Signed)
rx called into pharmacy

## 2012-12-06 NOTE — Telephone Encounter (Signed)
Okay to fill #[phone] x 0 

## 2012-12-06 NOTE — Telephone Encounter (Signed)
Last filled 10/24/12 at [facility].  Please advise.

## 2012-12-07 ENCOUNTER — Telehealth: Payer: Self-pay

## 2012-12-07 ENCOUNTER — Ambulatory Visit (INDEPENDENT_AMBULATORY_CARE_PROVIDER_SITE_OTHER): Payer: Medicare Other | Admitting: Internal Medicine

## 2012-12-07 ENCOUNTER — Encounter: Payer: Self-pay | Admitting: Internal Medicine

## 2012-12-07 VITALS — BP 148/70 | HR 80 | Temp 98.1°F | Wt 148.0 lb

## 2012-12-07 DIAGNOSIS — K4090 Unilateral inguinal hernia, without obstruction or gangrene, not specified as recurrent: Secondary | ICD-10-CM

## 2012-12-07 MED ORDER — HYDROCODONE-ACETAMINOPHEN 5-325 MG PO TABS: 1.0000 | ORAL_TABLET | Freq: Three times a day (TID) | ORAL | Status: AC | PRN

## 2012-12-07 NOTE — Telephone Encounter (Signed)
Dr Alphonsus Sias cannot offer opinion until sees pt; pt's wife scheduled appt today at 3:15pm.

## 2012-12-07 NOTE — Telephone Encounter (Signed)
pts wife said for 2 days pt complained on and off with left groin pain directly below where inguinal hernia is located. When pt has pain he cannot walk; pt fell 12/06/12 (no apparent injury) when had pain while walking.Pt saw Dr Carolynne Edouard 11/30/12 and truss was adjusted.Pts wife said Dr Carolynne Edouard did not want to do surgery due to dementia unless absolutely necessary. Dr Carolynne Edouard is not in office today.Pt has no abdominal pain,no N or V. Pt is taking Hydrocodone apap that Dr Achilles Dunk gave in 08/2012. Pt is almost out of med and request refill to [facility]. Pts wife does not want to take pt to [facility] Dr Karle Starch opinion if he thinks groin pain  That is below where hernia is is related to the inguinal hernia or separate issue.Please advise.Pt wife request call back today.

## 2012-12-07 NOTE — Assessment & Plan Note (Signed)
Ongoing issues Cognitive problems seem to be precluding proper use of truss Gets severe pain spells that wife has a hard time dealing with  I think though there are some increased risks with his comorbidities, surgery is going to be needed I will send my note to Dr Carolynne Edouard for his review My main concern is cognitive---with block and sedation, risk is minimized. I really think this is going to need to be done

## 2012-12-07 NOTE — Progress Notes (Signed)
Subjective:    Patient ID: [name] Rich, male    DOB: 06-28-1932, 78 y.o.   MRN: [id number]  HPI Here with wife  Never had the hernia surgery Just saw Dr Carolynne Edouard last week Adjusted the truss  Had terrible attack yesterday Couldn't lift his leg due to the pain and then he fell Hydrocodone does help after about 1-1.5 hours  Appetite is great Bowels are normal  Current Outpatient Prescriptions on File Prior to Visit  Medication Sig Dispense Refill  . ALPRAZolam (XANAX) 0.25 MG tablet TAKE 1/2 TO 1 TABLET BY MOUTH 3 TIMES A DAY AS NEEDED FOR ANXIETY  60 tablet  0  . aspirin EC 81 MG tablet Take 81 mg by mouth daily.      . Multiple Vitamin (MULTIVITAMIN WITH MINERALS) TABS Take 1 tablet by mouth daily.      . nadolol (CORGARD) 20 MG tablet TAKE 1 TABLET DAILY  30 tablet  3    Allergies  Allergen Reactions  . Allegra (Fexofenadine Hcl) Other (See Comments)    nausea  . Atorvastatin     REACTION: confusion  . Latex Hives  . Simvastatin     REACTION: zombie  . Adhesive (Tape) Rash    Past Medical History  Diagnosis Date  . CHF (congestive heart failure)   . Coronary artery disease     s/p CABG  . Ischemic cardiomyopathy   . Colon, diverticulosis   . GERD (gastroesophageal reflux disease)   . BPH (benign prostatic hyperplasia)     Dr. Achilles Dunk  . Heart block     pacer/defib 6/06-----Dr. Graciela Husbands  . AAA (abdominal aortic aneurysm)   . Hyperlipidemia   . Colon cancer 1993  . Hyperplastic colon polyp     Recurrent  . Hernia 2013    left  . Dementia of the Alzheimer's type   . GI bleed   . Diverticulosis   . Internal hemorrhoids     Past Surgical History  Procedure Date  . Coronary artery bypass graft 1997  . Colon resection 1993    R COLON - Cecal cancer  . Cholecystectomy   . Pacemaker insertion 02/1996  . Prostate biopsy 03/2003    negative  . Icd 05/2005    (TYLER) St Jude Atlas Plus  . Cardiac catheterization   . Insert / replace / remove pacemaker 2006,2013     pacer/defib  (St Jude), defib removed 2013  . Cardiolite  12/1999    negative  . Adenosine stress  05/2005    Fixed AMT defect, LV 30% (Dilated)  . Adenosine myoview 11/2006    Low risk  . Coronary angioplasty 12/2006    No significant problems  . Recurrent hyperplastic polyps   . Transurethral resection of prostate 9/13    Dr Achilles Dunk    Family History  Problem Relation Age of Onset  . Heart disease Mother   . Heart disease Father   . Colon cancer      pat cousins    History   Social History  . Marital Status: Married    Spouse Name: N/A    Number of Children: 3  . Years of Education: N/A   Occupational History  . retired [employer]   . Previously [employer]    Social History Main Topics  . Smoking status: Former Smoker -- 1.0 packs/day    Types: Cigarettes    Quit date: 03/13/1996  . Smokeless tobacco: Never Used  . Alcohol Use: Yes  Comment: rarely  . Drug Use: No  . Sexually Active: Not on file   Other Topics Concern  . Not on file   Social History Narrative   Married with 2 sons, 1 daughter   Review of Systems Weight is stable No fever    Objective:   Physical Exam  Constitutional: He appears well-developed and well-nourished. No distress.  Genitourinary:       Still with reducible left inguinal hernia Truss not properly positioned---I tried to get the wings under the hernia and pulling up---but he readjusts it  Psychiatric:       No insight and impaired judgement          Assessment & Plan:

## 2012-12-07 NOTE — Telephone Encounter (Signed)
Okay to add on at 3:15PM

## 2012-12-18 ENCOUNTER — Encounter: Payer: Self-pay | Admitting: Internal Medicine

## 2012-12-18 ENCOUNTER — Ambulatory Visit (INDEPENDENT_AMBULATORY_CARE_PROVIDER_SITE_OTHER): Payer: Medicare Other | Admitting: Internal Medicine

## 2012-12-18 VITALS — BP 122/68 | HR 64 | Ht 70.0 in | Wt 151.5 lb

## 2012-12-18 VITALS — Wt 149.0 lb

## 2012-12-18 DIAGNOSIS — Z950 Presence of cardiac pacemaker: Secondary | ICD-10-CM

## 2012-12-18 DIAGNOSIS — I10 Essential (primary) hypertension: Secondary | ICD-10-CM

## 2012-12-18 DIAGNOSIS — Z111 Encounter for screening for respiratory tuberculosis: Secondary | ICD-10-CM

## 2012-12-18 DIAGNOSIS — I2510 Atherosclerotic heart disease of native coronary artery without angina pectoris: Secondary | ICD-10-CM

## 2012-12-18 DIAGNOSIS — I442 Atrioventricular block, complete: Secondary | ICD-10-CM

## 2012-12-18 DIAGNOSIS — F0280 Dementia in other diseases classified elsewhere without behavioral disturbance: Secondary | ICD-10-CM

## 2012-12-18 LAB — PACEMAKER DEVICE OBSERVATION
AL AMPLITUDE: 1 mv
AL IMPEDENCE PM: 187.5 Ohm
ATRIAL PACING PM: 50
DEVICE MODEL PM: 7303807
RV LEAD IMPEDENCE PM: 675 Ohm
RV LEAD THRESHOLD: 1.25 V
VENTRICULAR PACING PM: 100

## 2012-12-18 MED ORDER — ALPRAZOLAM 0.25 MG PO TABS: 0.2500 mg | ORAL_TABLET | Freq: Two times a day (BID) | ORAL | Status: DC | PRN

## 2012-12-18 NOTE — Assessment & Plan Note (Signed)
The patient's device was interrogated.  The information was reviewed. No changes were made in the programming.    

## 2012-12-18 NOTE — Assessment & Plan Note (Signed)
Stable now FL-2 done for them to investigate AL facilities [place] form done PPD

## 2012-12-18 NOTE — Patient Instructions (Addendum)
Your physician wants you to follow-up in: 1 year with Dr Klein.  You will receive a reminder letter in the mail two months in advance. If you don't receive a letter, please call our office to schedule the follow-up appointment.  

## 2012-12-18 NOTE — Progress Notes (Signed)
Subjective:    Patient ID: [name] Rich, male    DOB: 03-Feb-1932, 79 y.o.   MRN: [id number]  HPI Here with wife The hernia seems to be better  Plans to start at the [facility] Plans to start with 1 day a week now---paperwork here Will be giving the xanax an hour before hand---they want Rx for the xanax to give there if he gets hostile  Plans to check some places for placement Looking at dementia unit at [facility] and [facility] (since AL there would be covered under their long term care insurance) Need to have FL-2 to investigate  Reviewed functional status FL-2 and [place] form done  Current Outpatient Prescriptions on File Prior to Visit  Medication Sig Dispense Refill  . ALPRAZolam (XANAX) 0.25 MG tablet TAKE 1/2 TO 1 TABLET BY MOUTH 3 TIMES A DAY AS NEEDED FOR ANXIETY  60 tablet  0  . aspirin EC 81 MG tablet Take 81 mg by mouth daily.      . Multiple Vitamin (MULTIVITAMIN WITH MINERALS) TABS Take 1 tablet by mouth daily.      . nadolol (CORGARD) 20 MG tablet TAKE 1 TABLET DAILY  30 tablet  3    Allergies  Allergen Reactions  . Allegra (Fexofenadine Hcl) Other (See Comments)    nausea  . Atorvastatin     REACTION: confusion  . Latex Hives  . Simvastatin     REACTION: zombie  . Adhesive (Tape) Rash    Past Medical History  Diagnosis Date  . CHF (congestive heart failure)   . Coronary artery disease     s/p CABG  . Ischemic cardiomyopathy   . Colon, diverticulosis   . GERD (gastroesophageal reflux disease)   . BPH (benign prostatic hyperplasia)     Dr. Achilles Dunk  . Heart block     pacer/defib 6/06-----Dr. Graciela Husbands  . AAA (abdominal aortic aneurysm)   . Hyperlipidemia   . Colon cancer 1993  . Hyperplastic colon polyp     Recurrent  . Hernia 2013    left  . Dementia of the Alzheimer's type   . GI bleed   . Diverticulosis   . Internal hemorrhoids     Past Surgical History  Procedure Date  . Coronary artery bypass graft 1997  . Colon resection 1993    R  COLON - Cecal cancer  . Cholecystectomy   . Pacemaker insertion 02/1996  . Prostate biopsy 03/2003    negative  . Icd 05/2005    (TYLER) St Jude Atlas Plus  . Cardiac catheterization   . Insert / replace / remove pacemaker 2006,2013    pacer/defib  (St Jude), defib removed 2013  . Cardiolite  12/1999    negative  . Adenosine stress  05/2005    Fixed AMT defect, LV 30% (Dilated)  . Adenosine myoview 11/2006    Low risk  . Coronary angioplasty 12/2006    No significant problems  . Recurrent hyperplastic polyps   . Transurethral resection of prostate 9/13    Dr Achilles Dunk    Family History  Problem Relation Age of Onset  . Heart disease Mother   . Heart disease Father   . Colon cancer      pat cousins    History   Social History  . Marital Status: Married    Spouse Name: N/A    Number of Children: 3  . Years of Education: N/A   Occupational History  . retired [employer]   .  Previously [employer]    Social History Main Topics  . Smoking status: Former Smoker -- 1.0 packs/day    Types: Cigarettes    Quit date: 03/13/1996  . Smokeless tobacco: Never Used  . Alcohol Use: Yes     Comment: rarely  . Drug Use: No  . Sexually Active: Not on file   Other Topics Concern  . Not on file   Social History Narrative   Married with 2 sons, 1 daughter    Review of Systems     Objective:   Physical Exam  Psychiatric:       Calm  Passive Does answer questions          Assessment & Plan:

## 2012-12-18 NOTE — Assessment & Plan Note (Signed)
Stable post pacing 

## 2012-12-18 NOTE — Progress Notes (Signed)
  HPI  [name] Rich is a 79 y.o. male Seen in followup for complete heart block in the setting of ischemic myopathy. He is status post ICD implantation which was changed out for a pacemaker 1/13 at that time. We repaired the RA lead, pirated a  Previously implanted RV lead and cappd the ICD lead   His dementia has gotten much worse. His wife is struggling with stress. Daycare is scheduled to begin at the end of the month  Past Medical History  Diagnosis Date  . CHF (congestive heart failure)   . Coronary artery disease     s/p CABG  . Ischemic cardiomyopathy   . Colon, diverticulosis   . GERD (gastroesophageal reflux disease)   . BPH (benign prostatic hyperplasia)     Dr. Achilles Dunk  . Heart block     pacer/defib 6/06-----Dr. Graciela Husbands  . AAA (abdominal aortic aneurysm)   . Hyperlipidemia   . Colon cancer 1993  . Cognitive impairment     mild    Past Surgical History  Procedure Date  . Coronary artery bypass graft 1997  . Colon resection     R COLON - Cecal cancer  . Cholecystectomy   . Pacemaker insertion 02/1996  . Prostate biopsy 03/2003    negative  . Icd 05/2005    (TYLER) St Jude Atlas Plus  . Cardiac catheterization   . Insert / replace / remove pacemaker 2006    pacer/defib  (St Jude)    Current Outpatient Prescriptions  Medication Sig Dispense Refill  . aspirin 81 MG tablet Take 81 mg by mouth daily.        . clopidogrel (PLAVIX) 75 MG tablet Take 75 mg by mouth daily.        Marland Kitchen dutasteride (AVODART) 0.5 MG capsule Take 0.5 mg by mouth daily.        . Multiple Vitamins-Minerals (CENTRUM SILVER PO) Take 1 tablet by mouth daily.        . nadolol (CORGARD) 20 MG tablet Take 20 mg by mouth daily.          Allergies  Allergen Reactions  . Atorvastatin     REACTION: confusion  . Simvastatin     REACTION: zombie    Review of Systems negative except from HPI and PMH  Physical Exam Well developed and well nourished in no acute distress HENT normal E scleral and  icterus clear Neck Supple Clear to ausculation Regular rate and rhythm, Soft No clubbing cyanosis and edema Alert and oriented, grossly normal motor and sensory function Skin Warm and Dry some ecchymoses    Assessment and  Plan

## 2012-12-18 NOTE — Addendum Note (Signed)
Addended by: Sueanne Margarita on: 12/18/2012 11:34 AM   Modules accepted: Orders

## 2012-12-20 LAB — TB SKIN TEST: TB Skin Test: NEGATIVE

## 2013-01-03 ENCOUNTER — Telehealth: Payer: Self-pay

## 2013-01-03 NOTE — Telephone Encounter (Signed)
pts wife left v/m requesting call back re: pts visit to the [facility] at [facility]; Haskell Flirt suggested Mrs. Miralles call Dr Alphonsus Sias. Mrs Berg wants to speak with Dr Alphonsus Sias; pt took Xanax one hour before going to the [facility]; within 30 mins of arrival pt got agitated and Mrs. Henneman was called to pick pt up.; wants to discuss options.

## 2013-01-04 NOTE — Telephone Encounter (Signed)
He was terrible there----"the worst ever" according to Clydie Braun Not invited to even try again Wife has accepted that this is not an option  Will have her try to titrate the alprazolam to see if there is a dose that will calm him some Told her to try taking up to 4 at a time to see if there is an effective dose  She does have aides at home but hoped to find "something for him to do"  She will call back with information about how he does with increased dose (would consider lorazepam then)

## 2013-01-07 ENCOUNTER — Telehealth: Payer: Self-pay | Admitting: Internal Medicine

## 2013-01-07 NOTE — Telephone Encounter (Signed)
Pt's wife left v/m xanax causng h/a; pts wife request call back to find out substitute med for xanax. CAN already received message. Mrs Chenier contact # [phone].

## 2013-01-07 NOTE — Telephone Encounter (Signed)
Patient Information:  Caller Name: Glena Norfolk  Phone: [phone]  Patient: [name] Rich, [name] Rich  Gender: Male  DOB: 1932-08-06  Age: 79 Years  PCP: Tillman Abide [facility])  Office Follow Up:  Does the office need to follow up with this patient?: Yes  Instructions For The Office: OFFICE PLEASE REVIEW WITH MD AND CALL PT BACK TODAY  RN Note:  Instructed that message will be sent to MD for his review; caller would like Dr Alphonsus Sias to review her message and call her back  Symptoms  Reason For Call & Symptoms: Wife is calling and states that she needed to give Dr Alphonsus Sias a message about how Xanax was working for her husband;  Xanax was ordered for pt for dementia; instructed to double the dose; pt has been having headaches with this med; and since doubling the  dose; he is having worse headaches;  pt became more aggitated after doubling the dose instead of calming him down;  pt did not want triage she just wanted a message sent to Dr Alphonsus Sias; she states that she did leave a VM message as well for the MD  Reviewed Health History In EMR: N/A  Reviewed Medications In EMR: N/A  Reviewed Allergies In EMR: N/A  Reviewed Surgeries / Procedures: N/A  Date of Onset of Symptoms: Unknown  Guideline(s) Used:  No Protocol Available - Information Only  Disposition Per Guideline:   Discuss with PCP and Callback by Nurse Today  Reason For Disposition Reached:   Nursing judgment  Advice Given:  N/A

## 2013-01-08 MED ORDER — LORAZEPAM 0.5 MG PO TABS: 0.5000 mg | ORAL_TABLET | Freq: Three times a day (TID) | ORAL | Status: DC | PRN

## 2013-01-08 NOTE — Telephone Encounter (Signed)
Discussed with wife Headaches from alprazolam really became apparent after dose increased  Discussed alternatives Will try lorazepam instead Consider SSRI like citalopram at next visit  Dee, Phone in Rx to pharmacy Lorazepam 0.5mg  #90 x 0 1 tid prn for anxiety or agitation

## 2013-01-08 NOTE — Telephone Encounter (Signed)
rx called into pharmacy

## 2013-02-19 ENCOUNTER — Telehealth: Payer: Self-pay

## 2013-02-19 NOTE — Telephone Encounter (Signed)
Patients wife notified

## 2013-02-19 NOTE — Telephone Encounter (Signed)
Pt s wife said pt has been uncontrollable at times and has left home x 3 since 02/14/13;Mrs Kluth wants to know if pt can take 2 tabs of Lorazepam instead of one tab tid.Please advise. Pt has appt with Dr Alphonsus Sias on 02/25/13. [facility].

## 2013-02-19 NOTE — Telephone Encounter (Signed)
Reviewed chart. Ok to use 2 pills instead of one as needed, but start slow (ie don't take 2 pills tid all at once). Update [place] if any questions about instructions or concerns.

## 2013-02-25 ENCOUNTER — Encounter: Payer: Self-pay | Admitting: Internal Medicine

## 2013-02-25 ENCOUNTER — Other Ambulatory Visit: Payer: Self-pay | Admitting: Internal Medicine

## 2013-02-25 ENCOUNTER — Ambulatory Visit (INDEPENDENT_AMBULATORY_CARE_PROVIDER_SITE_OTHER): Payer: Medicare Other | Admitting: Internal Medicine

## 2013-02-25 VITALS — BP 128/70 | HR 70 | Temp 98.0°F | Wt 152.0 lb

## 2013-02-25 DIAGNOSIS — F0280 Dementia in other diseases classified elsewhere without behavioral disturbance: Secondary | ICD-10-CM

## 2013-02-25 MED ORDER — CITALOPRAM HYDROBROMIDE 10 MG PO TABS: 10.0000 mg | ORAL_TABLET | Freq: Every day | ORAL | Status: DC

## 2013-02-25 NOTE — Assessment & Plan Note (Signed)
Has had steady decline Now more agitated Wife is at her wits end Looking for placement now 25 minute visit with plan coordination for almost all the visit Will add citalopram at low dose--cautioned about increased agitation at first

## 2013-02-25 NOTE — Telephone Encounter (Signed)
rx called into pharmacy
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
## 2013-02-25 NOTE — Telephone Encounter (Signed)
Okay #120 x 0 Note new instructions given today

## 2013-02-27 ENCOUNTER — Telehealth: Payer: Self-pay | Admitting: Internal Medicine

## 2013-02-27 NOTE — Telephone Encounter (Signed)
Patient Information:  Caller Name: Glena Norfolk  Phone: [phone]  Patient: [name] Rich, [name] Rich  Gender: Male  DOB: [birth year]  Age: 79 Years  PCP: Tillman Abide [facility])  Office Follow Up:  Does the office need to follow up with this patient?: Yes  Instructions For The Office: Patient has been more aggressive/hard to manage since increasing Ativan from 1 tab three times/day to 2 tabs  three times/day on 3-17. Sleeps more but when wakes is hostile/difficult to manage. Please call and advise if MD wants to change medicine as wife fearful is cause of worsing symptom.  RN Note:  Wife states he is very aggressive when wakes after taking the increased dose of Ativan. Denies he has harmed self or anyone else but is "harder to handle".  Symptoms  Reason For Call & Symptoms: Was prescribed an increase in ativan to 2 tabs three times/day when in office 3-17. Sleeps more but when wakes, is combative.  Reviewed Health History In EMR: Yes  Reviewed Medications In EMR: Yes  Reviewed Allergies In EMR: Yes  Reviewed Surgeries / Procedures: Yes  Date of Onset of Symptoms: 02/25/2013  Guideline(s) Used:  Confusion - Delirium  Disposition Per Guideline:   See Today or Tomorrow in Office  Reason For Disposition Reached:   Longstanding confusion (e.g., dementia, stroke) and worsening  Advice Given:  N/A  Patient Will Follow Care Advice:  YES

## 2013-02-28 NOTE — Telephone Encounter (Signed)
Discussed with wife  May be more sedated and confused with increased lorazepam--thus exacerbating his agitation Hopefully this will improve with the citalopram In the meantime, try using the lorazepam only PRN She understood and will try this (actually had wondered about doing this herself)

## 2013-02-28 NOTE — Addendum Note (Signed)
Addended by: Tillman Abide I on: 02/28/2013 01:05 PM   Modules accepted: Orders

## 2013-03-06 ENCOUNTER — Telehealth: Payer: Self-pay | Admitting: *Deleted

## 2013-03-06 NOTE — Telephone Encounter (Signed)
Form on your desk for prior auth of lorazepam

## 2013-03-06 NOTE — Telephone Encounter (Signed)
Form done 

## 2013-03-06 NOTE — Telephone Encounter (Signed)
Form faxed back.

## 2013-03-08 ENCOUNTER — Other Ambulatory Visit: Payer: Self-pay | Admitting: Internal Medicine

## 2013-03-08 NOTE — Telephone Encounter (Signed)
I increased the citalopram but don't remember changing back to xanax (and I didn't note it) Okay for them to try this though #120 x 0 Hopefully we won't need new authorization

## 2013-03-08 NOTE — Telephone Encounter (Signed)
Spoke with wife and she states pt is now back on Xanax 2 tabs in the morning and 2 tabs in the evening? We just did prior auth for lorazepam and per wife this was stopped last week at his office visit? Please advise

## 2013-03-08 NOTE — Telephone Encounter (Signed)
Spoke with patient's wife and advised results rx called into pharmacy

## 2013-03-13 ENCOUNTER — Ambulatory Visit: Payer: Medicare Other | Admitting: Internal Medicine

## 2013-03-13 ENCOUNTER — Encounter: Payer: Self-pay | Admitting: Internal Medicine

## 2013-03-13 ENCOUNTER — Ambulatory Visit (INDEPENDENT_AMBULATORY_CARE_PROVIDER_SITE_OTHER): Payer: Medicare Other | Admitting: Internal Medicine

## 2013-03-13 VITALS — BP 120/68 | HR 61 | Temp 98.5°F | Ht 71.0 in | Wt 155.0 lb

## 2013-03-13 DIAGNOSIS — F0281 Dementia in other diseases classified elsewhere with behavioral disturbance: Secondary | ICD-10-CM

## 2013-03-13 DIAGNOSIS — G309 Alzheimer's disease, unspecified: Secondary | ICD-10-CM

## 2013-03-13 NOTE — Assessment & Plan Note (Signed)
Ongoing issues Moving to [place] [facility]---main building in locked 2nd floor Wife will still try to get him into [facility] if a bed opens up

## 2013-03-13 NOTE — Progress Notes (Signed)
Subjective:    Patient ID: [name] Rich, male    DOB: 07-26-1932, 79 y.o.   MRN: [id number]  HPI Here with wife He has not tried to elope since last visit Still has afternoon confusion esp around 4PM Prolonged time trying to initiate sleep---then eventually he sleeps okay  Hallucinations---people there telling him not to go into the bed  Unclear if citalopram has helped Getting the xanax 2AM and 2PM  Going to the 2nd floor at [facility] Current Outpatient Prescriptions on File Prior to Visit  Medication Sig Dispense Refill  . ALPRAZolam (XANAX) 0.25 MG tablet Take 2 tablets by mouth in the morning and 2 tablets by mouth in the evening as needed  120 tablet  0  . aspirin EC 81 MG tablet Take 81 mg by mouth daily.      . citalopram (CELEXA) 10 MG tablet Take 1 tablet (10 mg total) by mouth daily.  30 tablet  3  . nadolol (CORGARD) 20 MG tablet TAKE 1 TABLET DAILY  30 tablet  3   No current facility-administered medications on file prior to visit.    Allergies  Allergen Reactions  . Allegra (Fexofenadine Hcl) Other (See Comments)    nausea  . Atorvastatin     REACTION: confusion  . Latex Hives  . Simvastatin     REACTION: zombie  . Adhesive (Tape) Rash  . Alprazolam     headaches    Past Medical History  Diagnosis Date  . CHF (congestive heart failure)   . Coronary artery disease     s/p CABG  . Ischemic cardiomyopathy   . Colon, diverticulosis   . GERD (gastroesophageal reflux disease)   . BPH (benign prostatic hyperplasia)     Dr. Achilles Dunk  . Heart block     pacer/defib 6/06-----Dr. Graciela Husbands  . AAA (abdominal aortic aneurysm)   . Hyperlipidemia   . Colon cancer 1993  . Hyperplastic colon polyp     Recurrent  . Hernia 2013    left  . Dementia of the Alzheimer's type   . GI bleed   . Diverticulosis   . Internal hemorrhoids     Past Surgical History  Procedure Laterality Date  . Coronary artery bypass graft  1997  . Colon resection  1993    R COLON -  Cecal cancer  . Cholecystectomy    . Pacemaker insertion  02/1996  . Prostate biopsy  03/2003    negative  . Icd  05/2005    (TYLER) St Jude Atlas Plus  . Cardiac catheterization    . Insert / replace / remove pacemaker  2006,2013    pacer/defib  (St Jude), defib removed 2013  . Cardiolite   12/1999    negative  . Adenosine stress   05/2005    Fixed AMT defect, LV 30% (Dilated)  . Adenosine myoview  11/2006    Low risk  . Coronary angioplasty  12/2006    No significant problems  . Recurrent hyperplastic polyps    . Transurethral resection of prostate  9/13    Dr Achilles Dunk    Family History  Problem Relation Age of Onset  . Heart disease Mother   . Heart disease Father   . Colon cancer      pat cousins    History   Social History  . Marital Status: Married    Spouse Name: N/A    Number of Children: 3  . Years of Education: N/A   Occupational  History  . retired [employer]   . Previously [employer]    Social History Main Topics  . Smoking status: Former Smoker -- 1.00 packs/day    Types: Cigarettes    Quit date: 03/13/1996  . Smokeless tobacco: Never Used  . Alcohol Use: Yes     Comment: rarely  . Drug Use: No  . Sexually Active: Not on file   Other Topics Concern  . Not on file   Social History Narrative   Married with 2 sons, 1 daughter         Review of Systems Appetite is good Weight up 3#     Objective:   Physical Exam  Neurological:  Confused but at his recent baseline  Psychiatric:  Calm now           Assessment & Plan:

## 2013-03-14 ENCOUNTER — Telehealth: Payer: Self-pay

## 2013-03-14 NOTE — Telephone Encounter (Signed)
Pt's wife left v/m;pt is on waiting list at [facility]; hopeful admission will be soon. No call back needed.

## 2013-03-14 NOTE — Telephone Encounter (Signed)
Good to hear I hope he gets in there

## 2013-03-18 ENCOUNTER — Telehealth: Payer: Self-pay | Admitting: Internal Medicine

## 2013-03-18 ENCOUNTER — Telehealth: Payer: Self-pay
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
## 2013-03-18 NOTE — Telephone Encounter (Signed)
Faxed back to [employer] [employer] [facility]

## 2013-03-18 NOTE — Telephone Encounter (Signed)
FL-2 was given to wife to pass on Will fax the copy which I resigned today

## 2013-03-18 NOTE — Telephone Encounter (Signed)
Caller: Linda/; Phone: [phone]; Reason for Call: Bonita Quin states pt is moving into the [facility] assisted living today (03/18/13).  Bonita Quin needs the [facility] form to be signed again verifying the medications for patient and sent back to her.  (fax # [phone]).  OFFICE PLEASE MAKE SURE FORM IS COMPLETED AND SENT BACK FOR PATIENT.

## 2013-03-18 NOTE — Telephone Encounter (Signed)
Discussed with her  Will need to start antipsychotics due to the physical aggression and danger he poses  Will set up appt in the next few days to discuss options, dosing etc  Lyla Son, Please call her to set up appt sometime this week (she may want it ASAP)

## 2013-03-19 ENCOUNTER — Encounter: Payer: Self-pay | Admitting: Internal Medicine

## 2013-03-19 ENCOUNTER — Ambulatory Visit (INDEPENDENT_AMBULATORY_CARE_PROVIDER_SITE_OTHER): Payer: Medicare Other | Admitting: Internal Medicine

## 2013-03-19 VITALS — BP 130/70 | HR 69 | Wt 156.0 lb

## 2013-03-19 DIAGNOSIS — G309 Alzheimer's disease, unspecified: Secondary | ICD-10-CM

## 2013-03-19 DIAGNOSIS — F0281 Dementia in other diseases classified elsewhere with behavioral disturbance: Secondary | ICD-10-CM

## 2013-03-19 MED ORDER — QUETIAPINE FUMARATE 50 MG PO TABS: 50.0000 mg | ORAL_TABLET | Freq: Every day | ORAL | Status: DC
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
## 2013-03-19 NOTE — Assessment & Plan Note (Signed)
Increased agitation and violent physical aggression Discussed with son and wife Need to start antipsychotic---discussed cardiovascular risk and increased fall risk Could increase eating/high sugars also

## 2013-03-19 NOTE — Patient Instructions (Signed)
Please start the seroquel with 1/2 tab tonight and tomorrow night. On Thursday, increase to a full tablet for 2 days. If he is still agitated by Saturday, increase to 2 tabs at bedtime. Let me know if there are problems

## 2013-03-19 NOTE — Telephone Encounter (Signed)
I spoke with patient's wife and she scheduled appointment for today at 12:15.

## 2013-03-22 ENCOUNTER — Encounter: Payer: Self-pay | Admitting: Internal Medicine

## 2013-03-25 ENCOUNTER — Telehealth: Payer: Self-pay
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
## 2013-03-25 NOTE — Telephone Encounter (Signed)
Tim pts son left v/m pt was seen on 03/19/13 and family has implemented the plan given at that time; Seroquel being given as instructed but is not working. Tim request call to his mother for further guidance. Pt is still very aggressive.Please advise.

## 2013-04-01 ENCOUNTER — Encounter: Payer: Self-pay | Admitting: *Deleted

## 2013-04-02 ENCOUNTER — Encounter: Payer: Self-pay | Admitting: Internal Medicine

## 2013-04-02 ENCOUNTER — Ambulatory Visit (INDEPENDENT_AMBULATORY_CARE_PROVIDER_SITE_OTHER): Payer: Medicare Other | Admitting: Internal Medicine

## 2013-04-02 VITALS — BP 122/70 | HR 76 | Temp 98.2°F | Wt 161.0 lb

## 2013-04-02 DIAGNOSIS — G309 Alzheimer's disease, unspecified: Secondary | ICD-10-CM

## 2013-04-02 DIAGNOSIS — R609 Edema, unspecified: Secondary | ICD-10-CM | POA: Insufficient documentation

## 2013-04-02 DIAGNOSIS — F0281 Dementia in other diseases classified elsewhere with behavioral disturbance: Secondary | ICD-10-CM

## 2013-04-02 NOTE — Progress Notes (Signed)
Subjective:    Patient ID: [name] Rich, male    DOB: 10/19/1932, 79 y.o.   MRN: [id number]  HPI Here with wife Doing better Now on just 100mg  at bedtime Tried the morning dose and it knocked him out Used 2 days--and then stopped it Behavior seems to be much better just on the evening dose Has tried to leave house (like trying to climb out the window but couldn't---only when sitter is there) Going to [facility] for 30 day respite soon--will consider long term placement if it goes well Was able to use the prior FL-2  Has been sleeping fairly well Appetite is good  Current Outpatient Prescriptions on File Prior to Visit  Medication Sig Dispense Refill  . ALPRAZolam (XANAX) 0.25 MG tablet Take 0.5 mg by mouth 2 (two) times daily. And  Bid prn      . aspirin EC 81 MG tablet Take 81 mg by mouth daily.      . citalopram (CELEXA) 10 MG tablet Take 20 mg by mouth daily.      Marland Kitchen desoximetasone (TOPICORT) 0.05 % cream Apply 1 application topically 2 (two) times daily as needed.      Marland Kitchen HYDROcodone-acetaminophen (NORCO/VICODIN) 5-325 MG per tablet Take 1 tablet by mouth 3 (three) times daily as needed for pain.      . Multiple Vitamins-Minerals (CENTRUM SILVER ULTRA MENS) TABS Take 1 tablet by mouth daily.      . nadolol (CORGARD) 20 MG tablet TAKE 1 TABLET DAILY  30 tablet  3  . QUEtiapine (SEROQUEL) 50 MG tablet Take 1-2 tablets (50-100 mg total) by mouth at bedtime.  60 tablet  1   No current facility-administered medications on file prior to visit.    Allergies  Allergen Reactions  . Allegra (Fexofenadine Hcl) Other (See Comments)    nausea  . Atorvastatin     REACTION: confusion  . Latex Hives  . Simvastatin     REACTION: zombie  . Adhesive (Tape) Rash    Past Medical History  Diagnosis Date  . CHF (congestive heart failure)   . Coronary artery disease     s/p CABG  . Ischemic cardiomyopathy   . Colon, diverticulosis   . GERD (gastroesophageal reflux disease)   .  BPH (benign prostatic hyperplasia)     Dr. Achilles Dunk  . Heart block     pacer/defib 6/06-----Dr. Graciela Husbands  . AAA (abdominal aortic aneurysm)   . Hyperlipidemia   . Colon cancer 1993  . Hyperplastic colon polyp     Recurrent  . Hernia 2013    left  . Dementia of the Alzheimer's type   . GI bleed   . Diverticulosis   . Internal hemorrhoids     Past Surgical History  Procedure Laterality Date  . Coronary artery bypass graft  1997  . Colon resection  1993    R COLON - Cecal cancer  . Cholecystectomy    . Pacemaker insertion  02/1996  . Prostate biopsy  03/2003    negative  . Icd  05/2005    (TYLER) St Jude Atlas Plus  . Cardiac catheterization    . Insert / replace / remove pacemaker  2006,2013    pacer/defib  (St Jude), defib removed 2013  . Cardiolite   12/1999    negative  . Adenosine stress   05/2005    Fixed AMT defect, LV 30% (Dilated)  . Adenosine myoview  11/2006    Low risk  . Coronary angioplasty  12/2006    No significant problems  . Recurrent hyperplastic polyps    . Transurethral resection of prostate  9/13    Dr Achilles Dunk    Family History  Problem Relation Age of Onset  . Heart disease Mother   . Heart disease Father   . Colon cancer      pat cousins    History   Social History  . Marital Status: Married    Spouse Name: N/A    Number of Children: 3  . Years of Education: N/A   Occupational History  . retired [employer]   . Previously [employer]    Social History Main Topics  . Smoking status: Former Smoker -- 1.00 packs/day    Types: Cigarettes    Quit date: 03/13/1996  . Smokeless tobacco: Never Used  . Alcohol Use: Yes     Comment: rarely  . Drug Use: No  . Sexually Active: Not on file   Other Topics Concern  . Not on file   Social History Narrative   Married with 2 sons, 1 daughter         Review of Systems Weight is up 5# Bowels okay    Objective:   Physical Exam  Neurological:  Passive but will briefly answer questions   Psychiatric:  Calm now Mood appears neutral          Assessment & Plan:

## 2013-04-02 NOTE — Assessment & Plan Note (Signed)
1+ pitting edema---likely from him sleeping in chair with his legs down Hopefully if he sleeps in bed at [facility]--that will improve Discussed support socks

## 2013-04-02 NOTE — Assessment & Plan Note (Signed)
Is better with the evening seroquel Will be going for respite and hopefully long term at [facility] (and then back to [facility] when a bed is available) No med changes needed now

## 2013-04-03 ENCOUNTER — Emergency Department (HOSPITAL_COMMUNITY)
Admission: EM | Admit: 2013-04-03 | Discharge: 2013-04-05 | Disposition: A | Discharge status: Other Acute Inpatient Hospital | Payer: Medicare Other | Attending: Emergency Medicine | Admitting: Emergency Medicine

## 2013-04-03 ENCOUNTER — Telehealth: Payer: Self-pay

## 2013-04-03 ENCOUNTER — Encounter (HOSPITAL_COMMUNITY): Payer: Self-pay | Admitting: Emergency Medicine

## 2013-04-03 DIAGNOSIS — Z862 Personal history of diseases of the blood and blood-forming organs and certain disorders involving the immune mechanism: Secondary | ICD-10-CM | POA: Insufficient documentation

## 2013-04-03 DIAGNOSIS — F0280 Dementia in other diseases classified elsewhere without behavioral disturbance: Secondary | ICD-10-CM | POA: Insufficient documentation

## 2013-04-03 DIAGNOSIS — Z87891 Personal history of nicotine dependence: Secondary | ICD-10-CM | POA: Insufficient documentation

## 2013-04-03 DIAGNOSIS — I509 Heart failure, unspecified: Secondary | ICD-10-CM | POA: Insufficient documentation

## 2013-04-03 DIAGNOSIS — F411 Generalized anxiety disorder: Secondary | ICD-10-CM | POA: Insufficient documentation

## 2013-04-03 DIAGNOSIS — Z87448 Personal history of other diseases of urinary system: Secondary | ICD-10-CM | POA: Insufficient documentation

## 2013-04-03 DIAGNOSIS — Z8679 Personal history of other diseases of the circulatory system: Secondary | ICD-10-CM | POA: Insufficient documentation

## 2013-04-03 DIAGNOSIS — F22 Delusional disorders: Secondary | ICD-10-CM | POA: Insufficient documentation

## 2013-04-03 DIAGNOSIS — Z7982 Long term (current) use of aspirin: Secondary | ICD-10-CM | POA: Insufficient documentation

## 2013-04-03 DIAGNOSIS — G309 Alzheimer's disease, unspecified: Secondary | ICD-10-CM | POA: Insufficient documentation

## 2013-04-03 DIAGNOSIS — Z8719 Personal history of other diseases of the digestive system: Secondary | ICD-10-CM | POA: Insufficient documentation

## 2013-04-03 DIAGNOSIS — I2510 Atherosclerotic heart disease of native coronary artery without angina pectoris: Secondary | ICD-10-CM | POA: Insufficient documentation

## 2013-04-03 DIAGNOSIS — Z91040 Latex allergy status: Secondary | ICD-10-CM | POA: Insufficient documentation

## 2013-04-03 DIAGNOSIS — Z85038 Personal history of other malignant neoplasm of large intestine: Secondary | ICD-10-CM | POA: Insufficient documentation

## 2013-04-03 DIAGNOSIS — Z8639 Personal history of other endocrine, nutritional and metabolic disease: Secondary | ICD-10-CM | POA: Insufficient documentation

## 2013-04-03 DIAGNOSIS — Z951 Presence of aortocoronary bypass graft: Secondary | ICD-10-CM | POA: Insufficient documentation

## 2013-04-03 DIAGNOSIS — R4587 Impulsiveness: Secondary | ICD-10-CM | POA: Insufficient documentation

## 2013-04-03 DIAGNOSIS — F29 Unspecified psychosis not due to a substance or known physiological condition: Secondary | ICD-10-CM | POA: Insufficient documentation

## 2013-04-03 DIAGNOSIS — Z79899 Other long term (current) drug therapy: Secondary | ICD-10-CM | POA: Insufficient documentation

## 2013-04-03 LAB — COMPREHENSIVE METABOLIC PANEL WITH GFR
ALT: 15 U/L (ref 0–53)
AST: 28 U/L (ref 0–37)
Albumin: 3.4 g/dL — ABNORMAL LOW (ref 3.5–5.2)
Alkaline Phosphatase: 82 U/L (ref 39–117)
BUN: 29 mg/dL — ABNORMAL HIGH (ref 6–23)
CO2: 22 meq/L (ref 19–32)
Calcium: 9.7 mg/dL (ref 8.4–10.5)
Chloride: 101 meq/L (ref 96–112)
Creatinine, Ser: 1.24 mg/dL (ref 0.50–1.35)
GFR calc Af Amer: 62 mL/min — ABNORMAL LOW
GFR calc non Af Amer: 54 mL/min — ABNORMAL LOW
Glucose, Bld: 131 mg/dL — ABNORMAL HIGH (ref 70–99)
Potassium: 4.2 meq/L (ref 3.5–5.1)
Sodium: 137 meq/L (ref 135–145)
Total Bilirubin: 0.5 mg/dL (ref 0.3–1.2)
Total Protein: 6.6 g/dL (ref 6.0–8.3)

## 2013-04-03 LAB — CBC
HCT: 36.9 % — ABNORMAL LOW (ref 39.0–52.0)
Hemoglobin: 12.8 g/dL — ABNORMAL LOW (ref 13.0–17.0)
MCH: 32.7 pg (ref 26.0–34.0)
MCHC: 34.7 g/dL (ref 30.0–36.0)
MCV: 94.1 fL (ref 78.0–100.0)
Platelets: 182 10*3/uL (ref 150–400)
RBC: 3.92 MIL/uL — ABNORMAL LOW (ref 4.22–5.81)
RDW: 13.1 % (ref 11.5–15.5)
WBC: 9.9 10*3/uL (ref 4.0–10.5)

## 2013-04-03 LAB — ETHANOL: Alcohol, Ethyl (B): <11 mg/dL (ref 0–11)

## 2013-04-03 MED ORDER — NADOLOL 20 MG PO TABS
20.0000 mg | ORAL_TABLET | Freq: Two times a day (BID) | ORAL | Status: DC
  Filled 2013-04-03 (×5): qty 1

## 2013-04-03 MED ORDER — QUETIAPINE FUMARATE 100 MG PO TABS
100.0000 mg | ORAL_TABLET | Freq: Every day | ORAL | Status: DC
  Administered 2013-04-04: 100 mg via ORAL
  Filled 2013-04-03: qty 1

## 2013-04-03 MED ORDER — CITALOPRAM HYDROBROMIDE 20 MG PO TABS
20.0000 mg | ORAL_TABLET | Freq: Every day | ORAL | Status: DC
  Filled 2013-04-03: qty 1

## 2013-04-03 MED ORDER — ASPIRIN EC 81 MG PO TBEC
81.0000 mg | DELAYED_RELEASE_TABLET | Freq: Every day | ORAL | Status: DC
  Filled 2013-04-03 (×2): qty 1

## 2013-04-03 MED ORDER — TRIAMCINOLONE ACETONIDE 0.025 % EX CREA
TOPICAL_CREAM | Freq: Two times a day (BID) | CUTANEOUS | Status: DC
  Filled 2013-04-03: qty 15

## 2013-04-03 MED ORDER — LORAZEPAM 1 MG PO TABS: 1.0000 mg | ORAL_TABLET | Freq: Three times a day (TID) | ORAL | Status: DC | PRN

## 2013-04-03 MED ORDER — HALOPERIDOL LACTATE 5 MG/ML IJ SOLN
5.0000 mg | Freq: Once | INTRAMUSCULAR | Status: AC
  Administered 2013-04-03: 5 mg via INTRAMUSCULAR
  Filled 2013-04-03: qty 1

## 2013-04-03 MED ORDER — HYDROCODONE-ACETAMINOPHEN 5-325 MG PO TABS: 1.0000 | ORAL_TABLET | Freq: Three times a day (TID) | ORAL | Status: DC | PRN

## 2013-04-03 MED ORDER — HALOPERIDOL LACTATE 5 MG/ML IJ SOLN
10.0000 mg | Freq: Three times a day (TID) | INTRAMUSCULAR | Status: DC | PRN
  Filled 2013-04-03 (×2): qty 1

## 2013-04-03 NOTE — ED Notes (Signed)
WUJ:[id number]<JY> Expected date:04/03/13<BR> Expected time: 9:05 PM<BR> Means of arrival:Ambulance<BR> Comments:<BR> 79 yo M  Combative behavior
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
## 2013-04-03 NOTE — ED Provider Notes (Signed)
History     CSN: [id number]  Arrival date & time 04/03/13  2119   First MD Initiated Contact with Patient 04/03/13 2143      Chief Complaint  Patient presents with  . Aggressive Behavior    (Consider location/radiation/quality/duration/timing/severity/associated sxs/prior treatment) HPI This patient with multiple medical problems presents from his nursing facility with concerns of aggressive, combative behavior. Per EMS and staff his behavior is typically disagreeable, but became pronounced tonight.  After prolonged outbursts, violent behavior, he was sent here for further evaluation and management. On my exam the patient is disoriented to all but self, uncooperative, flailing and level V caveat. Restraints were required for patient safety.  Past Medical History  Diagnosis Date  . CHF (congestive heart failure)   . Coronary artery disease     s/p CABG  . Ischemic cardiomyopathy   . Colon, diverticulosis   . GERD (gastroesophageal reflux disease)   . BPH (benign prostatic hyperplasia)     Dr. Achilles Dunk  . Heart block     pacer/defib 6/06-----Dr. Graciela Husbands  . AAA (abdominal aortic aneurysm)   . Hyperlipidemia   . Colon cancer 1993  . Hyperplastic colon polyp     Recurrent  . Hernia 2013    left  . Dementia of the Alzheimer's type   . GI bleed   . Diverticulosis   . Internal hemorrhoids     Past Surgical History  Procedure Laterality Date  . Coronary artery bypass graft  1997  . Colon resection  1993    R COLON - Cecal cancer  . Cholecystectomy    . Pacemaker insertion  02/1996  . Prostate biopsy  03/2003    negative  . Icd  05/2005    (TYLER) St Jude Atlas Plus  . Cardiac catheterization    . Insert / replace / remove pacemaker  2006,2013    pacer/defib  (St Jude), defib removed 2013  . Cardiolite   12/1999    negative  . Adenosine stress   05/2005    Fixed AMT defect, LV 30% (Dilated)  . Adenosine myoview  11/2006    Low risk  . Coronary angioplasty  12/2006   No significant problems  . Recurrent hyperplastic polyps    . Transurethral resection of prostate  9/13    Dr Achilles Dunk    Family History  Problem Relation Age of Onset  . Heart disease Mother   . Heart disease Father   . Colon cancer      pat cousins    History  Substance Use Topics  . Smoking status: Former Smoker -- 1.00 packs/day    Types: Cigarettes    Quit date: 03/13/1996  . Smokeless tobacco: Never Used  . Alcohol Use: Yes     Comment: rarely      Review of Systems  Unable to perform ROS: Psychiatric disorder    Allergies  Allegra; Atorvastatin; Latex; Simvastatin; and Adhesive  Home Medications   Current Outpatient Rx  Name  Route  Sig  Dispense  Refill  . ALPRAZolam (XANAX) 0.25 MG tablet   Oral   Take 0.5 mg by mouth 2 (two) times daily. And  Bid prn         . aspirin EC 81 MG tablet   Oral   Take 81 mg by mouth daily.         . citalopram (CELEXA) 10 MG tablet   Oral   Take 20 mg by mouth daily.         Marland Kitchen  desoximetasone (TOPICORT) 0.05 % cream   Topical   Apply 1 application topically 2 (two) times daily as needed.         Marland Kitchen HYDROcodone-acetaminophen (NORCO/VICODIN) 5-325 MG per tablet   Oral   Take 1 tablet by mouth 3 (three) times daily as needed for pain.         . Multiple Vitamins-Minerals (CENTRUM SILVER ULTRA MENS) TABS   Oral   Take 1 tablet by mouth daily.         . nadolol (CORGARD) 20 MG tablet      TAKE 1 TABLET DAILY   30 tablet   3   . QUEtiapine (SEROQUEL) 50 MG tablet   Oral   Take 100 mg by mouth at bedtime.           BP 140/66  Pulse 82  Resp 20  SpO2 100%  Physical Exam  Nursing note and vitals reviewed. Constitutional: He is oriented to person, place, and time. He appears well-developed. No distress.  HENT:  Head: Normocephalic and atraumatic.  Eyes: Conjunctivae and EOM are normal.  Cardiovascular: Normal rate and regular rhythm.   Pulmonary/Chest: Effort normal. No stridor. No respiratory  distress.  Abdominal: He exhibits no distension.  Musculoskeletal: He exhibits no edema.  Neurological: He is alert and oriented to person, place, and time.  Skin: Skin is warm and dry.  Psychiatric: His mood appears anxious. His affect is angry and labile. His speech is rapid and/or pressured, delayed and tangential. He is agitated, aggressive, hyperactive and combative. Thought content is paranoid and delusional. Cognition and memory are impaired. He expresses impulsivity.    ED Course  Procedures (including critical care time)  Labs Reviewed  CBC - Abnormal; Notable for the following:    RBC 3.92 (*)    Hemoglobin 12.8 (*)    HCT 36.9 (*)    All other components within normal limits  COMPREHENSIVE METABOLIC PANEL - Abnormal; Notable for the following:    Glucose, Bld 131 (*)    BUN 29 (*)    Albumin 3.4 (*)    GFR calc non Af Amer 54 (*)    GFR calc Af Amer 62 (*)    All other components within normal limits  ETHANOL  URINE RAPID DRUG SCREEN (HOSP PERFORMED)   No results found.   No diagnosis found.  Following initial evaluation, the patient continued to be violent, requiring provision of Haldol.  I discussed the patient's case with our behavioral health team, will assist with evaluation and management of the patient.  IVC papers were taken out before the patient by nursing home staff.  MDM  This patient presents disoriented, combative, aggressive. On exam the patient is disagreeable, requires sedation and restraints.  Per chart review, and EMS report this is a characteristically similar exacerbation of the patient's typical behavior.  The patient has no evidence of trauma, is hemodynamically stable, and labs are largely unremarkable.  He will receive further psychiatric evaluation and management.        Gerhard Munch, MD 04/03/13 [phone]

## 2013-04-03 NOTE — ED Notes (Signed)
Brought in by EMS from [facility] facility with c/o aggressive and combative behavior.  Per EMS, staff at the facility reported that pt has become frequently aggressive and combative towards staff and other residents at the facility, pt is uncontrollable-- pt has Alzheimer's.

## 2013-04-04 ENCOUNTER — Ambulatory Visit: Payer: Medicare Other | Admitting: Internal Medicine

## 2013-04-04 ENCOUNTER — Emergency Department (HOSPITAL_COMMUNITY): Payer: Medicare Other

## 2013-04-04 DIAGNOSIS — IMO0002 Reserved for concepts with insufficient information to code with codable children: Secondary | ICD-10-CM

## 2013-04-04 DIAGNOSIS — F0280 Dementia in other diseases classified elsewhere without behavioral disturbance: Secondary | ICD-10-CM

## 2013-04-04 DIAGNOSIS — G309 Alzheimer's disease, unspecified: Secondary | ICD-10-CM

## 2013-04-04 LAB — URINALYSIS, ROUTINE W REFLEX MICROSCOPIC
Bilirubin Urine: NEGATIVE
Glucose, UA: NEGATIVE mg/dL
Hgb urine dipstick: NEGATIVE
Ketones, ur: 15 mg/dL — AB
Leukocytes, UA: NEGATIVE
Nitrite: NEGATIVE
Protein, ur: NEGATIVE mg/dL
Specific Gravity, Urine: 1.027 (ref 1.005–1.030)
Urobilinogen, UA: 1 mg/dL (ref 0.0–1.0)
pH: 6.5 (ref 5.0–8.0)

## 2013-04-04 LAB — RAPID URINE DRUG SCREEN, HOSP PERFORMED
Amphetamines: NOT DETECTED
Barbiturates: NOT DETECTED
Benzodiazepines: POSITIVE — AB

## 2013-04-04 MED ORDER — HALOPERIDOL LACTATE 5 MG/ML IJ SOLN
5.0000 mg | Freq: Three times a day (TID) | INTRAMUSCULAR | Status: DC | PRN
  Administered 2013-04-04: 10 mg via INTRAMUSCULAR
  Administered 2013-04-04 – 2013-04-05 (×2): 5 mg via INTRAMUSCULAR
  Filled 2013-04-04 (×2): qty 1

## 2013-04-04 MED ORDER — CLONAZEPAM 0.5 MG PO TBDP: 0.5000 mg | ORAL_TABLET | Freq: Two times a day (BID) | ORAL | Status: DC | PRN

## 2013-04-04 MED ORDER — LORAZEPAM 2 MG/ML IJ SOLN
1.0000 mg | Freq: Once | INTRAMUSCULAR | Status: AC
  Administered 2013-04-04: 1 mg via INTRAMUSCULAR
  Filled 2013-04-04: qty 1

## 2013-04-04 MED ORDER — RISPERIDONE 1 MG PO TBDP
1.0000 mg | ORAL_TABLET | Freq: Two times a day (BID) | ORAL | Status: DC
  Filled 2013-04-04 (×3): qty 1

## 2013-04-04 MED ORDER — HALOPERIDOL LACTATE 5 MG/ML IJ SOLN
5.0000 mg | Freq: Once | INTRAMUSCULAR | Status: AC
  Administered 2013-04-04: 5 mg via INTRAMUSCULAR
  Filled 2013-04-04: qty 1

## 2013-04-04 NOTE — Telephone Encounter (Signed)
Med list adjusted

## 2013-04-04 NOTE — BHH Counselor (Signed)
Pt referred to [facility] center.  *Pt's spouse/medical POA Marten Iles would prefer patient to go to [facility] for treatment.  Pt also referred to [place], [address] [facility], [place], [place], and [place].  No beds @ [facility] and  [facility].  [facility] contacted; left a message on voicemail inquiring about bed availability.  [facility] has a Geriatric unit, however; unable to accommodate patients with a Dementia diagnosis.

## 2013-04-04 NOTE — Progress Notes (Signed)
[facility] from [facility], pending behavioral health placement. .Clinical social worker continuing to follow pt to assist with pt dc plans and further csw needs.   Catha Gosselin, LCSWA  [phone] .04/04/2013 1543pm

## 2013-04-04 NOTE — BHH Counselor (Signed)
Writer spoke to patients nurse-Carrie sts that patient is still sleep. Sts that she tried to get patients blood pressure, however; patient was irritable and drowsy. Writer will not assess patient at this time and as he is not alert, oriented, and drowsy from medications given.

## 2013-04-04 NOTE — BH Assessment (Signed)
Assessment Note   Barre Aydelott is an 79 y.o. male brought to the ED due to his reported disruptive behavior at [facility]. Patient was apparently admitted yesterday to the [facility]. Spoke to Catering manager of the facility Clifton Custard # [phone] and she explains that patient was only admitted for a few hours. Sts that patient immediately became agressive and combative with their staff. He ripped their court yard gate down. Patient was chemical restraints without any positive outcome. [employer] and EMS was then called to assist. Patient was restrained and handcuffed by [employer] until he calmed down. EMS transported patient to the ED. Apparently patient was also at [facility] previous to his admission to [facility]. Patient was reportedly at [facility] also for one day and discharged after becoming combative with staff.   Patient has not endorsed SI/HI. He also does not endorse AVH's. However, patient is poor historian. He is only oriented to person. He is currently calm and cooperative.  Collateral information from spouse/medical POA Polly Nazaryan: Writer spoke to patient's spouse Mrs. Driscilla Moats # [phone]. She provided insightful information regarding patients behaviors. Sts that he is diagnosed with Dementia-Alheimzer's Type with Behavioral Disturbance. Sts that patient was living with her up until 3-4 months ago. Patient began having behavioral issues late December 2013. Sts that he would become combative with associated impulsive outburst. She hired sitters to come in her home and assist with patients needs and behaviors. Patient needs assistance with all ADL's, however; uses the restroom independently. Sts that patient was also combative toward the sitters. Ms. Capistran was unable to care for patient any longer and sought ALF's with memory care units. Sts that he has been to 3-4 units including [facility] as the most recent. Sts that he was  discharged from the facilities due to his behaviors. Patient has no psychiatric history and has never been hospitalized on a behavioral health unit. Patients does not have a psychiatrist. However, his medications are managed by his PCP at [facility].   Pending Disposition:  Pt referred to [facility] (Geriatric Unit) for inpatient stabilization of his behaviors.   Axis I: Dementia of the Alheimzer's Type with behavioral disturbance; Mood Disorder NOS Axis II: Deferred Axis III:  Past Medical History  Diagnosis Date  . CHF (congestive heart failure)   . Coronary artery disease     s/p CABG  . Ischemic cardiomyopathy   . Colon, diverticulosis   . GERD (gastroesophageal reflux disease)   . BPH (benign prostatic hyperplasia)     Dr. Achilles Dunk  . Heart block     pacer/defib 6/06-----Dr. Graciela Husbands  . AAA (abdominal aortic aneurysm)   . Hyperlipidemia   . Colon cancer 1993  . Hyperplastic colon polyp     Recurrent  . Hernia 2013    left  . Dementia of the Alzheimer's type   . GI bleed   . Diverticulosis   . Internal hemorrhoids    Axis IV: other psychosocial or environmental problems and problems related to social environment Axis V: 21-30 behavior considerably influenced by delusions or hallucinations OR serious impairment in judgment, communication OR inability to function in almost all areas  Past Medical History:  Past Medical History  Diagnosis Date  . CHF (congestive heart failure)   . Coronary artery disease     s/p CABG  . Ischemic cardiomyopathy   . Colon, diverticulosis   . GERD (gastroesophageal reflux disease)   .  BPH (benign prostatic hyperplasia)     Dr. Achilles Dunk  . Heart block     pacer/defib 6/06-----Dr. Graciela Husbands  . AAA (abdominal aortic aneurysm)   . Hyperlipidemia   . Colon cancer 1993  . Hyperplastic colon polyp     Recurrent  . Hernia 2013    left  . Dementia of the Alzheimer's type   . GI bleed   . Diverticulosis   . Internal  hemorrhoids     Past Surgical History  Procedure Laterality Date  . Coronary artery bypass graft  1997  . Colon resection  1993    R COLON - Cecal cancer  . Cholecystectomy    . Pacemaker insertion  02/1996  . Prostate biopsy  03/2003    negative  . Icd  05/2005    (TYLER) St Jude Atlas Plus  . Cardiac catheterization    . Insert / replace / remove pacemaker  2006,2013    pacer/defib  (St Jude), defib removed 2013  . Cardiolite   12/1999    negative  . Adenosine stress   05/2005    Fixed AMT defect, LV 30% (Dilated)  . Adenosine myoview  11/2006    Low risk  . Coronary angioplasty  12/2006    No significant problems  . Recurrent hyperplastic polyps    . Transurethral resection of prostate  9/13    Dr Achilles Dunk    Family History:  Family History  Problem Relation Age of Onset  . Heart disease Mother   . Heart disease Father   . Colon cancer      pat cousins    Social History:  reports that he quit smoking about 17 years ago. His smoking use included Cigarettes. He smoked 1.00 pack per day. He has never used smokeless tobacco. He reports that  drinks alcohol. He reports that he does not use illicit drugs.  Additional Social History:  Alcohol / Drug Use Pain Medications: SEE MAR Prescriptions: SEE MAR Over the Counter: SEE MAR History of alcohol / drug use?: No history of alcohol / drug abuse  CIWA: CIWA-Ar BP: 125/66 mmHg Pulse Rate: 63 COWS:    Allergies:  Allergies  Allergen Reactions  . Allegra (Fexofenadine Hcl) Other (See Comments)    nausea  . Atorvastatin     REACTION: confusion  . Latex Hives  . Simvastatin     REACTION: zombie  . Adhesive (Tape) Rash    Home Medications:  (Not in a hospital admission)  OB/GYN Status:  No LMP for male patient.  General Assessment Data Location of Assessment: [facility] Living Arrangements: Other (Comment) (lived in [place] [facility] (1day), Prior facility was [facility]) Can pt return to current living arrangement?:  No Admission Status: Other (Comment) (spouse has medical MPOA; paperwork in chart) Is patient capable of signing voluntary admission?: No Transfer from: Acute Hospital Referral Source: Other ([facility])     Risk to self Suicidal Ideation: No Suicidal Intent: No Is patient at risk for suicide?: No Suicidal Plan?: No Access to Means: No What has been your use of drugs/alcohol within the last 12 months?:  (no reported use of alcohol and/or drugs) Previous Attempts/Gestures: No How many times?:  (n/a) Other Self Harm Risks:  (n/a) Triggers for Past Attempts: Other (Comment) (no previous attempts and/or gestures) Intentional Self Injurious Behavior: None Family Suicide History: Unknown Recent stressful life event(s):  (no reported stressors by patient or spouse) Persecutory voices/beliefs?: No Depression: No Depression Symptoms:  (no currentl symptoms or  history of depression) Substance abuse history and/or treatment for substance abuse?: No Suicide prevention information given to non-admitted patients: Not applicable  Risk to Others Homicidal Ideation: No Thoughts of Harm to Others: No Current Homicidal Intent: No Current Homicidal Plan: No Access to Homicidal Means: No Identified Victim:  (n/a) History of harm to others?: Yes (patient has been combative w/ private sitters & staff in the) Assessment of Violence: In past 6-12 months Violent Behavior Description:  (due to patients dementia dx's & confusion he has become comb) Does patient have access to weapons?: No Criminal Charges Pending?: No Does patient have a court date: No  Psychosis Hallucinations: None noted Delusions: None noted  Mental Status Report Appear/Hygiene: Disheveled Eye Contact: Poor Motor Activity: Freedom of movement Speech: Slow;Other (Comment) (somewhat logial answering in short phrases) Level of Consciousness: Alert Mood: Anxious;Suspicious;Helpless;Preoccupied Affect: Appropriate to  circumstance Anxiety Level: Minimal Thought Processes: Irrelevant Judgement: Impaired Orientation: Person Obsessive Compulsive Thoughts/Behaviors: None  Cognitive Functioning Concentration: Decreased Memory: Recent Impaired;Remote Impaired IQ: Average Insight: Poor Impulse Control: Poor Appetite: Fair Weight Loss:  (none reported) Weight Gain:  (none reported) Sleep: No Change Total Hours of Sleep:  (patient has slept ok here in the ED) Vegetative Symptoms: None  ADLScreening [facility] Assessment Services) Patient's cognitive ability adequate to safely complete daily activities?: No Patient able to express need for assistance with ADLs?: No Independently performs ADLs?: No  Abuse/Neglect [facility]) Physical Abuse: Denies Verbal Abuse: Denies Sexual Abuse: Denies  Prior Inpatient Therapy Prior Inpatient Therapy: No Prior Therapy Dates:  (n/a) Prior Therapy Facilty/Provider(s):  (n/a) Reason for Treatment:  (n/a)  Prior Outpatient Therapy Prior Outpatient Therapy: No Prior Therapy Dates:  (n/a) Prior Therapy Facilty/Provider(s):  (n/a) Reason for Treatment:  (n/a)  ADL Screening (condition at time of admission) Patient's cognitive ability adequate to safely complete daily activities?: No Patient able to express need for assistance with ADLs?: No Independently performs ADLs?: No Communication: Needs assistance Is this a change from baseline?: Pre-admission baseline Dressing (OT): Needs assistance Is this a change from baseline?: Pre-admission baseline Grooming: Needs assistance Is this a change from baseline?: Pre-admission baseline Feeding: Needs assistance Is this a change from baseline?: Pre-admission baseline Bathing: Needs assistance Is this a change from baseline?: Pre-admission baseline Toileting: Independent In/Out Bed: Independent Is this a change from baseline?: Pre-admission baseline Walks in Home: Independent Weakness of Legs: None Weakness of Arms/Hands:  None  Home Assistive Devices/Equipment Home Assistive Devices/Equipment: None    Abuse/Neglect Assessment (Assessment to be complete while patient is alone) Physical Abuse: Denies Verbal Abuse: Denies Sexual Abuse: Denies Exploitation of patient/patient's resources: Denies Self-Neglect: Denies Values / Beliefs Cultural Requests During Hospitalization: None Spiritual Requests During Hospitalization: None Consults Spiritual Care Consult Needed: No Social Work Consult Needed: No Merchant navy officer (For Healthcare) Advance Directive: Patient does not have advance directive Nutrition Screen- [facility] Adult/[facility] Patient's home diet: Regular  Additional Information 1:1 In Past 12 Months?: No CIRT Risk: No Elopement Risk: No Does patient have medical clearance?: Yes     Disposition:  Disposition Initial Assessment Completed for this Encounter: Yes Disposition of Patient: Inpatient treatment program;Referred to [facility]) Type of inpatient treatment program: Adult Patient referred to: Other (Comment) [facility])  On Site Evaluation by:   Reviewed with Physician:     Melynda Ripple [facility] 04/04/2013 2:58 PM

## 2013-04-04 NOTE — ED Notes (Signed)
MD at bedside. Dr. Isaac Laud

## 2013-04-04 NOTE — ED Notes (Signed)
Pt came out of room into hallway, pt's safety sitter tried to redirect pt back to his room. Pt became very aggressive and was trying to shut the room door locking the sitter out.  Pt was refusing to get changed out of his soiled clothing into paper scrubs.

## 2013-04-04 NOTE — Telephone Encounter (Signed)
Discussed with Lynetta (?) yesterday Order given for seroquel 50mg  bid prn with plans to change standing dose to 100mg  bid depending on how he did Will consider lorazepam gel as well  She is sending order for me to sign

## 2013-04-04 NOTE — ED Provider Notes (Signed)
79 y.o. Male with agitation and aggressive behavior sent here from facility and awaiting placement by [facility].   Results for orders placed during the hospital encounter of 04/03/13  CBC      Result Value Range   WBC 9.9  4.0 - 10.5 K/uL   RBC 3.92 (*) 4.22 - 5.81 MIL/uL   Hemoglobin 12.8 (*) 13.0 - 17.0 g/dL   HCT 16.1 (*) 09.6 - 04.5 %   MCV 94.1  78.0 - 100.0 fL   MCH 32.7  26.0 - 34.0 pg   MCHC 34.7  30.0 - 36.0 g/dL   RDW 40.9  81.1 - 91.4 %   Platelets 182  150 - 400 K/uL  COMPREHENSIVE METABOLIC PANEL      Result Value Range   Sodium 137  135 - 145 mEq/L   Potassium 4.2  3.5 - 5.1 mEq/L   Chloride 101  96 - 112 mEq/L   CO2 22  19 - 32 mEq/L   Glucose, Bld 131 (*) 70 - 99 mg/dL   BUN 29 (*) 6 - 23 mg/dL   Creatinine, Ser 7.82  0.50 - 1.35 mg/dL   Calcium 9.7  8.4 - 95.6 mg/dL   Total Protein 6.6  6.0 - 8.3 g/dL   Albumin 3.4 (*) 3.5 - 5.2 g/dL   AST 28  0 - 37 U/L   ALT 15  0 - 53 U/L   Alkaline Phosphatase 82  39 - 117 U/L   Total Bilirubin 0.5  0.3 - 1.2 mg/dL   GFR calc non Af Amer 54 (*) >90 mL/min   GFR calc Af Amer 62 (*) >90 mL/min  ETHANOL      Result Value Range   Alcohol, Ethyl (B) <11  0 - 11 mg/dL    Date: 21/30/8657  Rate: 82  Rhythm: normal sinus rhythm  QRS Axis: left  Intervals: normal  ST/T Wave abnormalities: nonspecific ST changes  Conduction Disutrbances:left bundle branch block  Narrative Interpretation:   Old EKG Reviewed: changes noted two previous ekgs reviewed of 12/18/12 in paced rhythm.  Plan follow up u/a and cxr  10:41 PM Nurse states that he tried to hit x-Saif Peter tech and that is why it wasn't performed.  CXR to be done portable.  Urinalysis encouraged to be collected.    12:12 AM Urine clear cxr without focal abnormality  Patient cleared as prior request by act. Checked out with Dr. Verdis Prime, MD 04/05/13 [phone]

## 2013-04-04 NOTE — BHH Counselor (Signed)
Declined at [facility] due to patient acuity.

## 2013-04-04 NOTE — BHH Counselor (Signed)
Patient's spouse and medical POA Mrs. Hermes Wafer # [phone] would like to be notified when patient is accepted to a [facility] facility.

## 2013-04-04 NOTE — ED Provider Notes (Addendum)
Pt sleeping on my exam this morning. Apparently had been extremely combative prior to this and received 10mg  of haldol IM. He is pending evaluation.   Raeford Razor, MD 04/04/13 [phone]  Pt being reviewed by [facility]. Requesting UA, EKG and CXR. Ordered.   Raeford Razor, MD 04/04/13 1537

## 2013-04-04 NOTE — BHH Counselor (Signed)
04/04/13 @ 0904 Attempted to assess patient this am, however; he was medicated w/ Haldol and sleeping. ACT was no able to assess. Nursing staff will notify [facility] when patient wakes up.

## 2013-04-04 NOTE — ED Notes (Signed)
Staff attempted to get pt's blood pressure but pt refused and had to be redirected for attempting to be aggressive towards staff

## 2013-04-04 NOTE — Consult Note (Signed)
Reason for Consult: Dementia with agitation Referring Physician: Dr. Bascom Levels [name] Rich is an 79 y.o. male.  HPI: Patient was seen and chart reviewed. Case was discussed with the assessment constant who has obtained information from patient spouse. She provided insightful information regarding patients behaviors. Sts that he is diagnosed with Dementia-Alheimzers Type with Behavioral Disturbance. Patient was living with her up until 3-4 months ago. Patient began having behavioral issues late December 2013. He would become combative with associated impulsive outburst. She hired sitters to come in her home and assist with patients needs and behaviors. Patient was also combative toward the sitters. Ms. Ebron was unable to care for patient any longer and sought ALF's with memory care Units. He has been placed to 3-4 units including [facility] as the most recent. He was discharged from the facilities due to his uncontrollable and unmanageable combative behaviors. Patient has no psychiatric history and has never been hospitalized. Patient has been receiving Seroquel and Celexa, which was managed by his PCP at [facility]. Patient wife stated that she has a power of attorney for healthcare and is living arrangements.   Patient was brought in to [facility] long emergency department by emergency medical services after prolonged outbursts, violent behavior. Patient has significant memory deficit on unable to recall his surroundings names of the people he said associated with her contact numbers. Patient was hostile and uncooperative. He knows his first name and last name and after that he was not oriented to time place person and situation.   MSE: Patient was awake, alert but not oriented to time place person and situation. Patient denies suicidal or homicidal ideation and stated I am not going to hurt other people. Patient needed redirection and needed security next to him to prevent further agitation  and aggressive behaviors. Patient has no insight judgment and impulse control.  Past Medical History  Diagnosis Date  . CHF (congestive heart failure)   . Coronary artery disease     s/p CABG  . Ischemic cardiomyopathy   . Colon, diverticulosis   . GERD (gastroesophageal reflux disease)   . BPH (benign prostatic hyperplasia)     Dr. Achilles Dunk  . Heart block     pacer/defib 6/06-----Dr. Graciela Husbands  . AAA (abdominal aortic aneurysm)   . Hyperlipidemia   . Colon cancer 1993  . Hyperplastic colon polyp     Recurrent  . Hernia 2013    left  . Dementia of the Alzheimer's type   . GI bleed   . Diverticulosis   . Internal hemorrhoids     Past Surgical History  Procedure Laterality Date  . Coronary artery bypass graft  1997  . Colon resection  1993    R COLON - Cecal cancer  . Cholecystectomy    . Pacemaker insertion  02/1996  . Prostate biopsy  03/2003    negative  . Icd  05/2005    ([facility]) St Jude Atlas Plus  . Cardiac catheterization    . Insert / replace / remove pacemaker  2006,2013    pacer/defib  (St Jude), defib removed 2013  . Cardiolite   12/1999    negative  . Adenosine stress   05/2005    Fixed AMT defect, LV 30% (Dilated)  . Adenosine myoview  11/2006    Low risk  . Coronary angioplasty  12/2006    No significant problems  . Recurrent hyperplastic polyps    . Transurethral resection of prostate  9/13  Dr Achilles Dunk    Family History  Problem Relation Age of Onset  . Heart disease Mother   . Heart disease Father   . Colon cancer      pat cousins    Social History:  reports that he quit smoking about 17 years ago. His smoking use included Cigarettes. He smoked 1.00 pack per day. He has never used smokeless tobacco. He reports that  drinks alcohol. He reports that he does not use illicit drugs.  Allergies:  Allergies  Allergen Reactions  . Allegra (Fexofenadine Hcl) Other (See Comments)    nausea  . Atorvastatin     REACTION: confusion  . Latex Hives  .  Simvastatin     REACTION: zombie  . Adhesive (Tape) Rash    Medications: I have reviewed the patient's current medications.  Results for orders placed during the hospital encounter of 04/03/13 (from the past 48 hour(s))  CBC     Status: Abnormal   Collection Time    04/03/13 10:29 PM      Result Value Range   WBC 9.9  4.0 - 10.5 K/uL   RBC 3.92 (*) 4.22 - 5.81 MIL/uL   Hemoglobin 12.8 (*) 13.0 - 17.0 g/dL   HCT 16.1 (*) 09.6 - 04.5 %   MCV 94.1  78.0 - 100.0 fL   MCH 32.7  26.0 - 34.0 pg   MCHC 34.7  30.0 - 36.0 g/dL   RDW 40.9  81.1 - 91.4 %   Platelets 182  150 - 400 K/uL  COMPREHENSIVE METABOLIC PANEL     Status: Abnormal   Collection Time    04/03/13 10:29 PM      Result Value Range   Sodium 137  135 - 145 mEq/L   Potassium 4.2  3.5 - 5.1 mEq/L   Chloride 101  96 - 112 mEq/L   CO2 22  19 - 32 mEq/L   Glucose, Bld 131 (*) 70 - 99 mg/dL   BUN 29 (*) 6 - 23 mg/dL   Creatinine, Ser 7.82  0.50 - 1.35 mg/dL   Calcium 9.7  8.4 - 95.6 mg/dL   Total Protein 6.6  6.0 - 8.3 g/dL   Albumin 3.4 (*) 3.5 - 5.2 g/dL   AST 28  0 - 37 U/L   ALT 15  0 - 53 U/L   Alkaline Phosphatase 82  39 - 117 U/L   Total Bilirubin 0.5  0.3 - 1.2 mg/dL   GFR calc non Af Amer 54 (*) >90 mL/min   GFR calc Af Amer 62 (*) >90 mL/min   Comment:            The eGFR has been calculated     using the CKD EPI equation.     This calculation has not been     validated in all clinical     situations.     eGFR's persistently     <90 mL/min signify     possible Chronic Kidney Disease.  ETHANOL     Status: None   Collection Time    04/03/13 10:29 PM      Result Value Range   Alcohol, Ethyl (B) <11  0 - 11 mg/dL   Comment:            LOWEST DETECTABLE LIMIT FOR     SERUM ALCOHOL IS 11 mg/dL     FOR MEDICAL PURPOSES ONLY    No results found.  Positive for aggressive behavior, anxiety, behavior  problems and dementia and behavioral problems Blood pressure 125/66, pulse 63, resp. rate 19, SpO2  98.00%.   Assessment/Plan: Alzheimer Dementia with agitation  Recommendation: Acute psychiatric hospitalization for crisis stabilization and controlling his agitation. We'll discontinue Seroquel and Celexa which were not helpful to control his emotions and behaviors. We'll start risperidone 1 mg disintegrated tablets twice daily and Klonopin 0.5 mg disintegrating tablets twice daily to control combative behaviors.  [name] Rich,JANARDHAHA R. 04/04/2013, 2:05 PM

## 2013-04-05 DIAGNOSIS — F0390 Unspecified dementia without behavioral disturbance: Secondary | ICD-10-CM

## 2013-04-05 MED ORDER — LORAZEPAM 2 MG/ML IJ SOLN
1.0000 mg | Freq: Four times a day (QID) | INTRAMUSCULAR | Status: DC | PRN
  Administered 2013-04-05: 1 mg via INTRAMUSCULAR
  Filled 2013-04-05: qty 1

## 2013-04-05 MED ORDER — LORAZEPAM 1 MG PO TABS: 1.0000 mg | ORAL_TABLET | Freq: Four times a day (QID) | ORAL | Status: DC | PRN

## 2013-04-05 NOTE — ED Notes (Signed)
One bag pt belongings placed in locker 34

## 2013-04-05 NOTE — BHH Counselor (Signed)
Patients spouse/medical POA called requesting updates regarding patients status. Writer updated spouse and informed her that patient continues to pend acceptance at [facility]. She was also informed that she will no longer need to sign patient in to [facility] as he is now IVC. Patients spouse sts that she will bring his clothing here to [facility] but does not want to physically see patient.   Patient's nurse Minerva Areola was informed of the above information.

## 2013-04-05 NOTE — Progress Notes (Signed)
Patient accepted to [facility] toDr. Jae Dire. RN can call report to [phone].   Marland KitchenCatha Gosselin, Theresia Majors  [phone] 04/05/2013.d 1625pm

## 2013-04-05 NOTE — BHH Counselor (Signed)
Contacted [facility] this am and spoke to Viola. Sts that she has all of the documents ready to review by their psychiatrist. Delorise Shiner sts that she will call with a updated disposition as soon as she is able to do so.   Patient's spouse/medical POA was updated with the above information.

## 2013-04-05 NOTE — Consult Note (Signed)
Reason for Consult:Dementia with agitation Referring Physician:Dr. Decklyn Hyder [name] Rich is an 79 y.o. male.  HPI: patient is seen and case discussed with treatment team. He was pacing in hallway and trying to open the doors and he needed repeated redirection for to his room. Staff reported his medication has no impact on his behaviors. He was easily irritable but not showed aggression today.   MSE: he has been anxious and pacing on the unit and has brief verbal response like "no" when given redirection by staff members. He has poor insight, judgment and impulses.    Past Medical History  Diagnosis Date  . CHF (congestive heart failure)   . Coronary artery disease     s/p CABG  . Ischemic cardiomyopathy   . Colon, diverticulosis   . GERD (gastroesophageal reflux disease)   . BPH (benign prostatic hyperplasia)     Dr. Achilles Rich  . Heart block     pacer/defib 6/06-----Dr. Graciela Rich  . AAA (abdominal aortic aneurysm)   . Hyperlipidemia   . Colon cancer 1993  . Hyperplastic colon polyp     Recurrent  . Hernia 2013    left  . Dementia of the Alzheimer's type   . GI bleed   . Diverticulosis   . Internal hemorrhoids     Past Surgical History  Procedure Laterality Date  . Coronary artery bypass graft  1997  . Colon resection  1993    R COLON - Cecal cancer  . Cholecystectomy    . Pacemaker insertion  02/1996  . Prostate biopsy  03/2003    negative  . Icd  05/2005    ([name] Rich) St Jude Atlas Plus  . Cardiac catheterization    . Insert / replace / remove pacemaker  2006,2013    pacer/defib  (St Jude), defib removed 2013  . Cardiolite   12/1999    negative  . Adenosine stress   05/2005    Fixed AMT defect, LV 30% (Dilated)  . Adenosine myoview  11/2006    Low risk  . Coronary angioplasty  12/2006    No significant problems  . Recurrent hyperplastic polyps    . Transurethral resection of prostate  9/13    Dr [name] Rich    Family History  Problem Relation Age of Onset  . Heart disease  Mother   . Heart disease Father   . Colon cancer      pat cousins    Social History:  reports that he quit smoking about 17 years ago. His smoking use included Cigarettes. He smoked 1.00 pack per day. He has never used smokeless tobacco. He reports that  drinks alcohol. He reports that he does not use illicit drugs.  Allergies:  Allergies  Allergen Reactions  . Allegra (Fexofenadine Hcl) Other (See Comments)    nausea  . Atorvastatin     REACTION: confusion  . Latex Hives  . Simvastatin     REACTION: zombie  . Adhesive (Tape) Rash    Medications: I have reviewed the patient's current medications.  Results for orders placed during the hospital encounter of 04/03/13 (from the past 48 hour(s))  CBC     Status: Abnormal   Collection Time    04/03/13 10:29 PM      Result Value Range   WBC 9.9  4.0 - 10.5 K/uL   RBC 3.92 (*) 4.22 - 5.81 MIL/uL   Hemoglobin 12.8 (*) 13.0 - 17.0 g/dL   HCT 78.4 (*) 69.6 - 29.5 %  MCV 94.1  78.0 - 100.0 fL   MCH 32.7  26.0 - 34.0 pg   MCHC 34.7  30.0 - 36.0 g/dL   RDW 11.9  14.7 - 82.9 %   Platelets 182  150 - 400 K/uL  COMPREHENSIVE METABOLIC PANEL     Status: Abnormal   Collection Time    04/03/13 10:29 PM      Result Value Range   Sodium 137  135 - 145 mEq/L   Potassium 4.2  3.5 - 5.1 mEq/L   Chloride 101  96 - 112 mEq/L   CO2 22  19 - 32 mEq/L   Glucose, Bld 131 (*) 70 - 99 mg/dL   BUN 29 (*) 6 - 23 mg/dL   Creatinine, Ser 5.62  0.50 - 1.35 mg/dL   Calcium 9.7  8.4 - 13.0 mg/dL   Total Protein 6.6  6.0 - 8.3 g/dL   Albumin 3.4 (*) 3.5 - 5.2 g/dL   AST 28  0 - 37 U/L   ALT 15  0 - 53 U/L   Alkaline Phosphatase 82  39 - 117 U/L   Total Bilirubin 0.5  0.3 - 1.2 mg/dL   GFR calc non Af Amer 54 (*) >90 mL/min   GFR calc Af Amer 62 (*) >90 mL/min   Comment:            The eGFR has been calculated     using the CKD EPI equation.     This calculation has not been     validated in all clinical     situations.     eGFR's persistently      <90 mL/min signify     possible Chronic Kidney Disease.  [name] Rich     Status: None   Collection Time    04/03/13 10:29 PM      Result Value Range   Alcohol, Ethyl (B) <11  0 - 11 mg/dL   Comment:            LOWEST DETECTABLE LIMIT FOR     SERUM ALCOHOL IS 11 mg/dL     FOR MEDICAL PURPOSES ONLY  URINE RAPID DRUG SCREEN (HOSP PERFORMED)     Status: Abnormal   Collection Time    04/04/13 11:10 PM      Result Value Range   Opiates NONE DETECTED  NONE DETECTED   Cocaine NONE DETECTED  NONE DETECTED   Benzodiazepines POSITIVE (*) NONE DETECTED   Amphetamines NONE DETECTED  NONE DETECTED   Tetrahydrocannabinol NONE DETECTED  NONE DETECTED   Barbiturates NONE DETECTED  NONE DETECTED   Comment:            DRUG SCREEN FOR MEDICAL PURPOSES     ONLY.  IF CONFIRMATION IS NEEDED     FOR ANY PURPOSE, NOTIFY LAB     WITHIN 5 DAYS.                LOWEST DETECTABLE LIMITS     FOR URINE DRUG SCREEN     Drug Class       Cutoff (ng/mL)     Amphetamine      1000     Barbiturate      200     Benzodiazepine   200     Tricyclics       300     Opiates          300     Cocaine  300     THC              50  URINALYSIS, ROUTINE W REFLEX MICROSCOPIC     Status: Abnormal   Collection Time    04/04/13 11:10 PM      Result Value Range   Color, Urine YELLOW  YELLOW   APPearance CLEAR  CLEAR   Specific Gravity, Urine 1.027  1.005 - 1.030   pH 6.5  5.0 - 8.0   Glucose, UA NEGATIVE  NEGATIVE mg/dL   Hgb urine dipstick NEGATIVE  NEGATIVE   Bilirubin Urine NEGATIVE  NEGATIVE   Ketones, ur 15 (*) NEGATIVE mg/dL   Protein, ur NEGATIVE  NEGATIVE mg/dL   Urobilinogen, UA 1.0  0.0 - 1.0 mg/dL   Nitrite NEGATIVE  NEGATIVE   Leukocytes, UA NEGATIVE  NEGATIVE   Comment: MICROSCOPIC NOT DONE ON URINES WITH NEGATIVE PROTEIN, BLOOD, LEUKOCYTES, NITRITE, OR GLUCOSE <1000 mg/dL.    Dg Chest Port 1 View  04/04/2013  *RADIOLOGY REPORT*  Clinical Data: Aggressive behavior.  PORTABLE CHEST - 1 VIEW   Comparison: 08/06/2012  Findings: Heart size upper normal.  Status post median sternotomy and CABG.  Left chest wall battery pack with nonspecific lead tip positions, unchanged.  No confluent airspace opacity.  Mild interstitial prominence.  No pleural effusion or pneumothorax.  No acute osseous finding.  IMPRESSION: Mild interstitial prominence may be accentuated by portable technique or reflect interstitial edema or an atypical infection. Consider PA and lateral radiographs to better characterize.   Original Report Authenticated By: Jearld Lesch, M.D.     Positive for aggressive behavior, anxiety, bad mood, learning difficulty, sleep disturbance and pacing on the floor and poorly cooperative.  Blood pressure 162/81, pulse 80, temperature 97.6 F (36.4 C), temperature source Oral, resp. rate 18, SpO2 98.00%.   Assessment/Plan: Dementia with agitation  Recommendation:Will complete IVC petition as patient was unable to sign voluntary for admission to acute psych hospitalization. Pending placement and continue current medication treatment.  Dani Rich,[name] R. 04/05/2013, 12:54 PM

## 2013-04-05 NOTE — BHH Counselor (Signed)
[name] Rich  (Intake nurse at [facility]) asked for patients EKG and chest X-Ray. This information was faxed to the facility 04/05/13 @ [phone]. Confirmation received.   On-coming staff to follow up with patients disposition.

## 2013-04-05 NOTE — Progress Notes (Signed)
CSW faxed requested ivc paperwork to [facility] as requested pending review.   Catha Gosselin, LCSWA  [phone] .04/05/2013 1445pm

## 2013-04-05 NOTE — BHH Counselor (Signed)
Contacted [facility] regarding the referral that was made yesterday for [name]. Spoke to [employer] whom confirmed that information was received yesterday. Sts that their are no beds at this time, however; beds will open later today. Says that she will run the information by their physician later today.

## 2013-04-05 NOTE — BH Assessment (Signed)
BHH Assessment Progress Note    Patients spouse/POA updated regarding patients disposition. She has no further concerns at this time.

## 2013-04-08 ENCOUNTER — Telehealth: Payer: Self-pay | Admitting: Family Medicine

## 2013-04-08 NOTE — Telephone Encounter (Signed)
[name] Rich dropped off a [employer] Term Insurance Form to be filled out by Dr. Alphonsus Sias. She wants it mailed off when it is finished and wants to receive a call letting her know that it has been mailed to the insurance office.

## 2013-04-08 NOTE — Telephone Encounter (Signed)
Confidential Office Message [address] [place], [place] [address] p. [phone] f. [phone] To: Gar Gibbon (After Hours Triage) Fax: [phone] From: [facility] Date/ Time: 04/04/2013 9:38 PM Taken By: Arville Lime, CSR Caller: Glena Norfolk Facility: not collected Patient: [name], Rich DOB: 24-May-1932 Phone: [phone] Reason for Call: Pt has been in ED at [facility] since yesterday at 6:30 PM. No bath, they won't give sedative. Wife wants Dr. Alphonsus Sias to call ED to order sedatives so that he can be bathed. Regarding Appointment: Appt Date: Appt Time: Unknown Provider: Reason: Details: Confidential Outcome:

## 2013-04-09 NOTE — Telephone Encounter (Signed)
Form done 

## 2013-04-09 NOTE — Telephone Encounter (Signed)
Patient's wife notified and form mailed.

## 2013-04-23 ENCOUNTER — Other Ambulatory Visit: Payer: Self-pay | Admitting: *Deleted

## 2013-04-23 MED ORDER — NADOLOL 20 MG PO TABS: 20.0000 mg | ORAL_TABLET | Freq: Every day | ORAL | Status: AC

## 2013-06-03 ENCOUNTER — Ambulatory Visit (INDEPENDENT_AMBULATORY_CARE_PROVIDER_SITE_OTHER): Payer: Medicare Other | Admitting: General Surgery

## 2013-06-05 ENCOUNTER — Ambulatory Visit: Payer: Medicare Other | Admitting: Internal Medicine

## 2013-06-11 DEATH — deceased

## 2014-01-14 IMAGING — CT CT HEAD W/O CM
1 of 2 series · 15 of 30 positions shown, 19 images · non-contrast
Comparison: No priors.

CLINICAL DATA: Extremity weakness.

CT HEAD WITHOUT CONTRAST
TECHNIQUE: Contiguous axial images were obtained from the base of
the skull through the vertex without contrast.

[Series 3: recon 2: brain · axial · 0.47mm/px · z∈[+126,+275]mm · 15 of 64 slices shown, 19 images]
[im 4/64  brain]
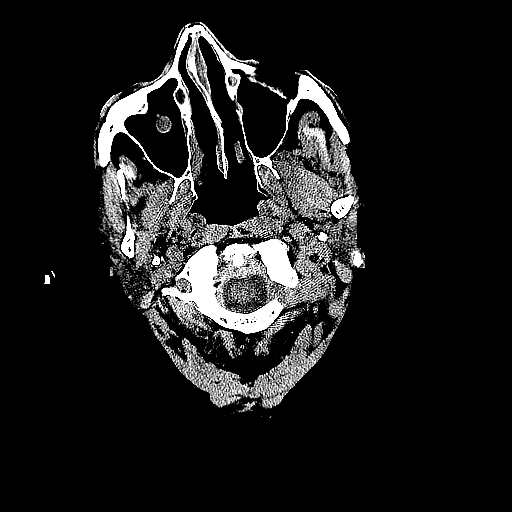
[im 4/64  bone]
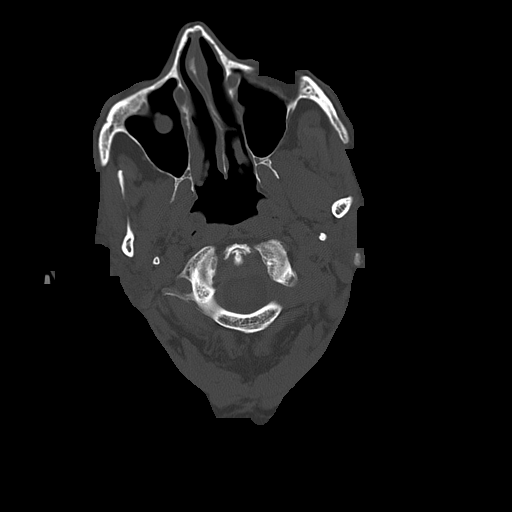
[im 7/64  brain]
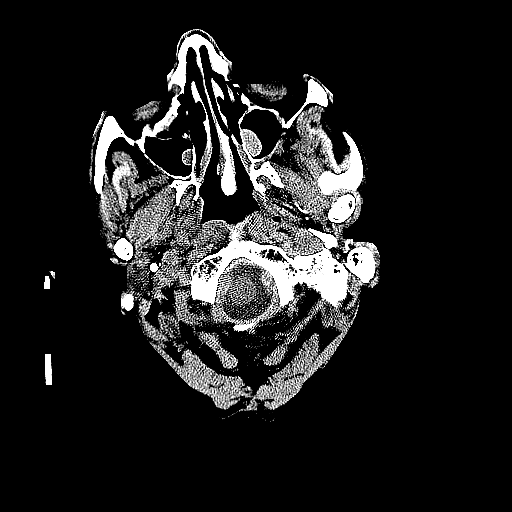
[im 14/64  brain]
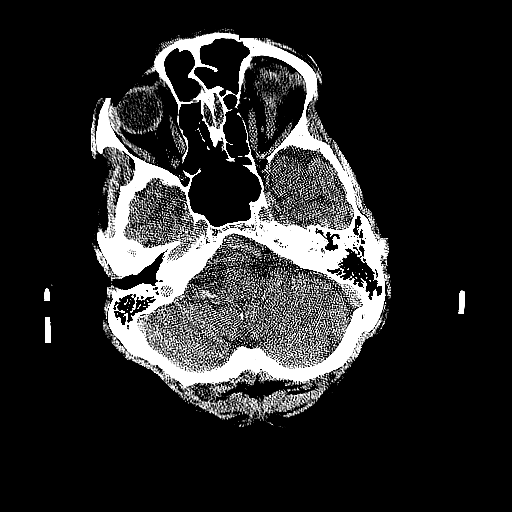
[im 17/64  brain]
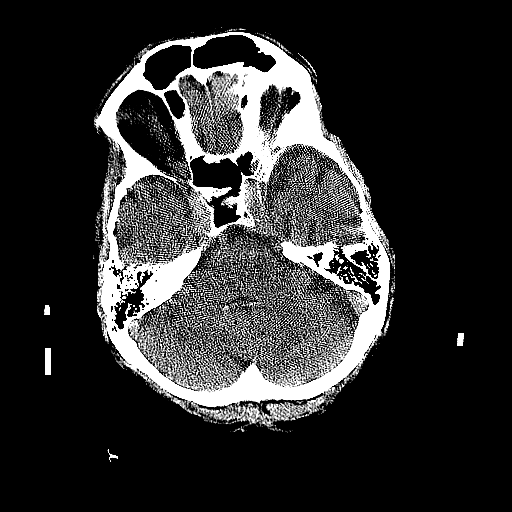
[im 20/64  brain]
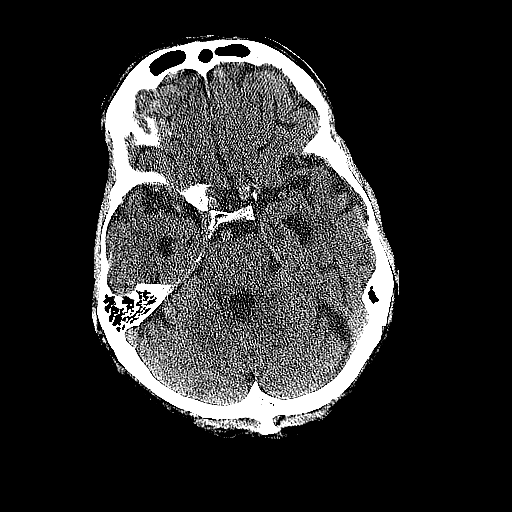
[im 20/64  bone]
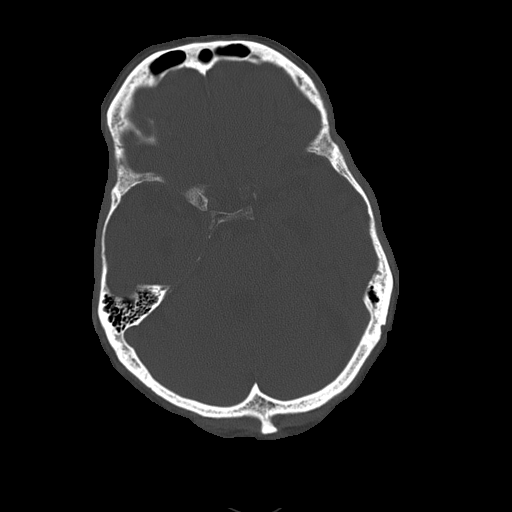
[im 24/64  brain]
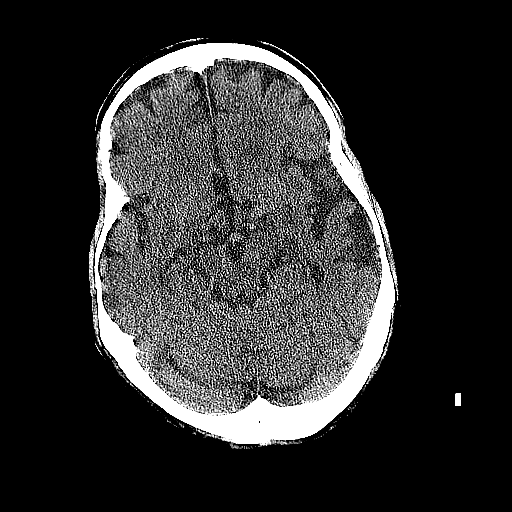
[im 27/64  brain]
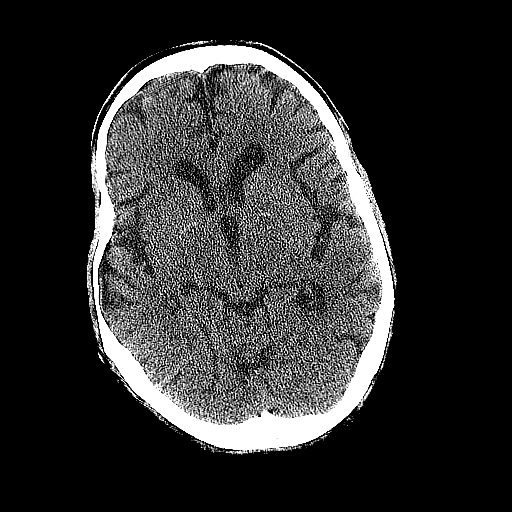
[im 34/64  brain]
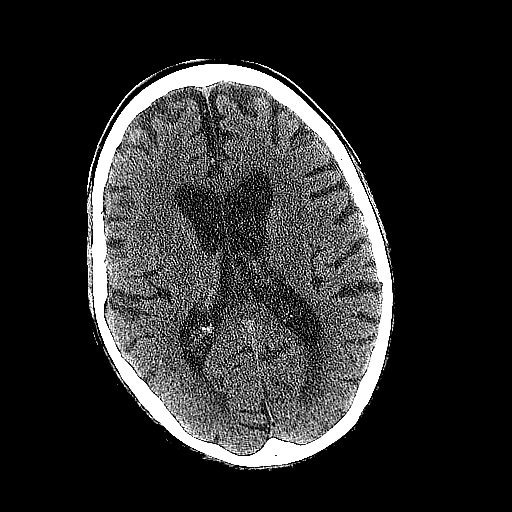
[im 37/64  brain]
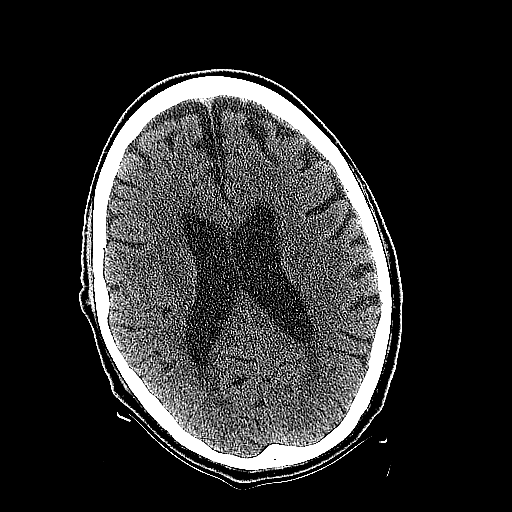
[im 37/64  bone]
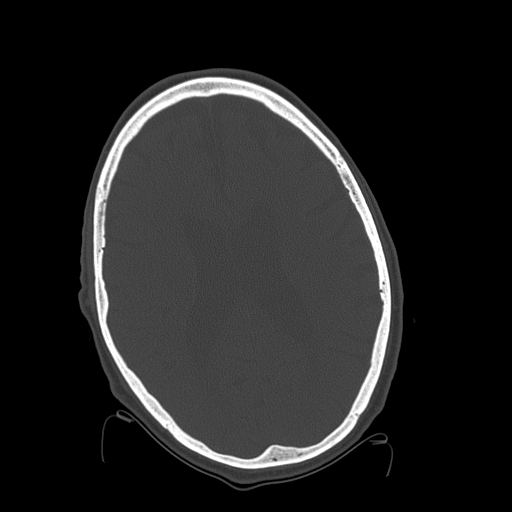
[im 40/64  brain]
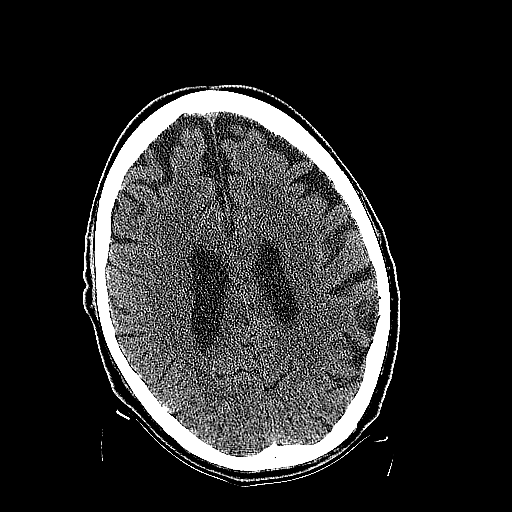
[im 44/64  brain]
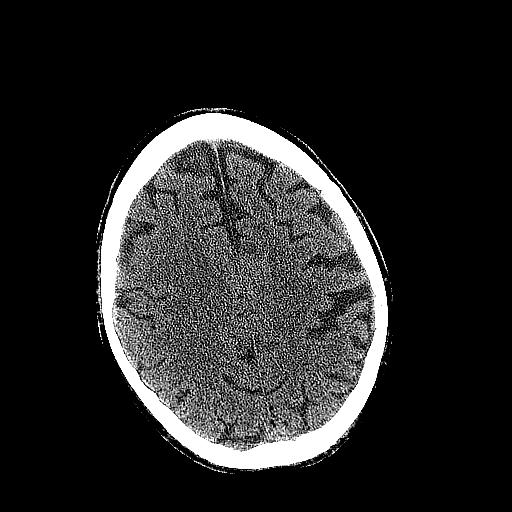
[im 47/64  brain]
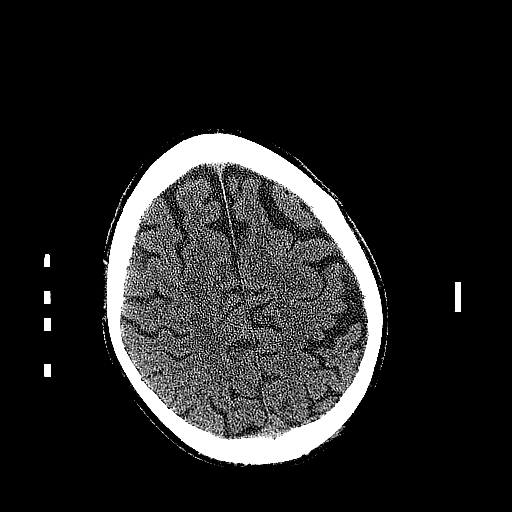
[im 54/64  brain]
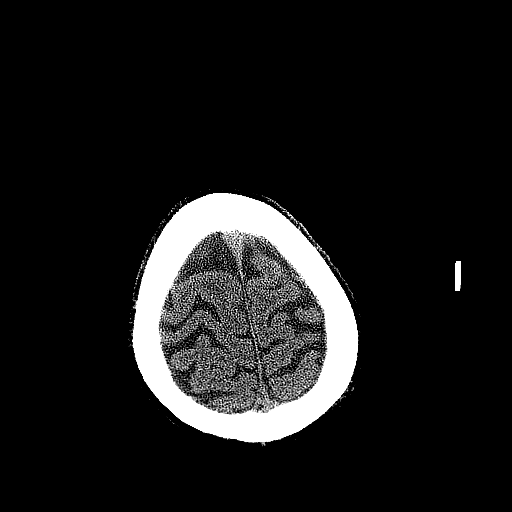
[im 54/64  bone]
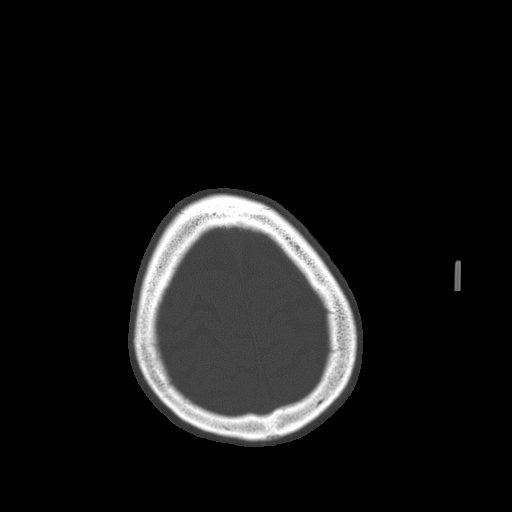
[im 57/64  brain]
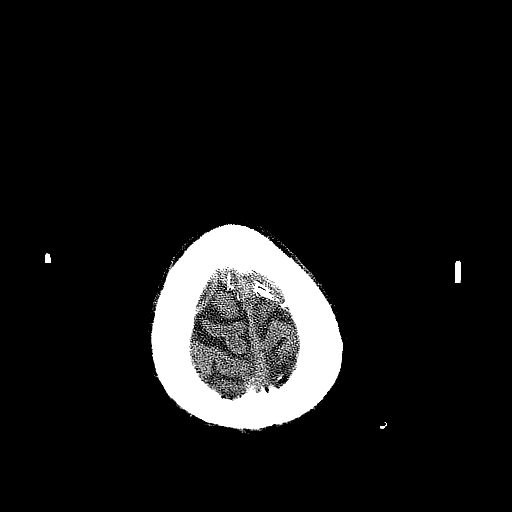
[im 60/64  brain]
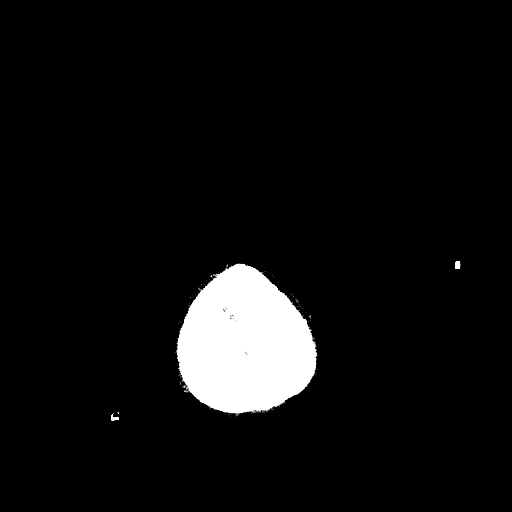

[15 of 30 positions shown; findings below may reference images not displayed]

FINDINGS: Mild cerebral and cerebellar atrophy is age appropriate.
There are some tiny calcifications in the basal ganglia
bilaterally, physiologic.  No acute intracranial abnormality.
Specifically, no definite area of suspicious for acute/subacute
cerebral ischemia, and no evidence of acute intracerebral
hemorrhage, no focal mass, mass effect, hydrocephalus or abnormal
intra or extra-axial fluid collections.  No acute displaced skull
fractures are identified.  Multiple polypoid soft tissue
attenuation lesions are noted in the maxillary sinuses bilaterally,
likely to represent small mucosal retention cysts or polyps.
IMPRESSION: 1.  No acute intracranial abnormalities to account for patient's
symptoms.
2.  Mild cerebral and cerebellar atrophy is age appropriate.
3.  Multiple small mucosal retention cysts or polyps in the
maxillary sinuses bilaterally.

## 2014-11-20 ENCOUNTER — Encounter (HOSPITAL_COMMUNITY): Payer: Self-pay | Admitting: Internal Medicine

## 2015-03-31 NOTE — Discharge Summary (Signed)
PATIENT NAME:  [name] Rich, [name] Rich MR#:  [id number] DATE OF BIRTH:  [birth year]  DATE OF ADMISSION:  09/17/2012 DATE OF DISCHARGE:  09/19/2012  PRESENTING COMPLAINT: Rectal bleed.   DISCHARGE DIAGNOSES:  1. Rectal bleed, suspected diverticular, resolved.  2. Posthemorrhagic anemia. No blood transfusion given.  3. Ischemic cardiomyopathy.  4. Advanced dementia.   CODE STATUS: FULL CODE.   MEDICATIONS:  1. Nadolol 20 mg daily.  2. Centrum Silver p.o. daily.  3. Acetaminophen 325 mg 2 tablets q.4 hours as needed.    FOLLOW UP: Follow up with Dr. Sharlett Iles, [facility], 10/22 at 3:45 p.m. and Dr. Silvio Pate, primary care physician, 10/18 at 12:00 p.m. on Friday.   CONSULTATION: GI consultation, Dr. Gustavo Lah.   PROCEDURES: None.   LABORATORY, DIAGNOSTIC AND RADIOLOGICAL DATA: Hemoglobin at discharge 8.4, hemoglobin at admission 10.4. Glucose 104, BUN 15, creatinine 0.7, sodium 142, potassium 3.9, chloride 110, bicarbonate 24. Comprehensive metabolic panel within normal limits except albumin of 2.9, total protein of 5.8. Creatinine 1.28. PT-INR 13.8 and 1.   BRIEF SUMMARY OF HOSPITAL COURSE: [name] Rich is a 81 year old Caucasian gentleman with history of ischemic cardiomyopathy, history of colon cancer status post resection, coronary artery disease with CABG and ischemic cardiomyopathy, status post pacemaker placement came in with rectal bleed that started on 10/07. He was admitted with:  1. Rectal bleed/lower GI bleed. Patient denied any abdominal pain, fever. He was admitted on the medical floor, started on IV fluids and clear liquid diet. His hemoglobin in July 2013 was 14.1. His hemoglobin at discharge was 8.7. Patient's rectal bleed resolved on its own. He has been kept off aspirin and Plavix, tolerated low residue diet prior to discharge. Patient was also seen by cardiology if needed for any GI procedures and he was cleared from cardiology standpoint to have any GI procedures if needed. Patient did  not require any blood transfusion.  2. Ischemic cardiomyopathy with history of pacemaker, ejection fraction unknown. Hold off patient's aspirin, Plavix. His nadolol was resumed.  3. History of colon cancer status post resection, chemotherapy, follows up with Dr. Sharlett Iles, GI at [facility]  4. Hyperlipidemia. Not on any medications. Patient has allergy listed to statins.  5. Recent TURP for benign prostatic hypertrophy last month.  6. Dementia, appears advance.  7. Hospital stay otherwise remained stable. CODE STATUS: Patient remained a FULL CODE.   TIME SPENT: 40 minutes.  ____________________________ Hart Rochester Posey Pronto, MD sap:cms D: 09/20/2012 13:59:47 ET T: 09/21/2012 11:27:10 ET JOB#: [id number]  cc: Cherokee Boccio A. Posey Pronto, MD, <Dictator> Dr. Sharlett Iles, [facility] Venia Carbon, MD Ilda Basset MD ELECTRONICALLY SIGNED 09/21/2012 21:00

## 2015-03-31 NOTE — Consult Note (Signed)
PATIENT NAME:  [name] Rich, [name] Rich MR#:  [id number] DATE OF BIRTH:  1932-08-24  DATE OF CONSULTATION:  09/17/2012  REFERRING PHYSICIAN:  Fritzi Mandes, MD   CONSULTING PHYSICIAN:  Lollie Sails, MD  REASON FOR CONSULTATION: Rectal bleeding.   HISTORY OF PRESENT ILLNESS: [name] Rich is a 81 year old Caucasian male who was brought to the Emergency Room by family members earlier this morning. Apparently early this morning the patient was noted to have some blood in the toilet bowl with a bowel movement. There has been no repeat since that time. Apparently, the patient also told his wife that he may have had some of this occur yesterday. The patient does take aspirin and Plavix on a daily basis as an outpatient for his cardiovascular disease. The patient has a history of dementia which makes him very difficult to examine in that he is not willing to allow this examiner at least to get close to him without intervention from his wife. There has been no problem with nausea, vomiting, or abdominal pain. He has no history of peptic ulcer disease. He does not take NSAIDs with the exception of the 81 mg daily aspirin. There has been no problem with heartburn or dysphagia. He apparently had a similar episode of rectal bleeding about two years ago, and he was followed up at [facility] in that regard. He was seen at the Emergency Room at [facility] who, when they checked him, found no evidence of rectal bleeding and no blood in the rectal vault. He was, however then seen at [facility] where they did a colonoscopy. The patient's wife states that she was told that the bleeding was due to his blood thinner and some internal hemorrhoids. He does have a history of colon cancer about 20 years ago for which he underwent resection and primary reanastomosis. There was no adjunct treatment.   GI FAMILY HISTORY: Pertinent for colon cancer in primary relatives.   PAST MEDICAL HISTORY: All of this was obtained from chart: 1. Ischemic  cardiomyopathy, unknown ejection fraction. The patient's wife does state that he does not undergo procedures with anesthesia and related to me that his last colonoscopy was actually done under a "block"?  2. History of pacemaker placement.  3. History of dementia, as noted.  4. Hyperlipidemia.  5. Benign prostatic hypertrophy with TURP about a month ago with Dr. Jacqlyn Larsen.  6. History of gastroesophageal reflux. 7. Coronary artery disease with coronary artery bypass grafting.  PAST SURGICAL HISTORY:  1. Coronary artery bypass graft. 2. Cholecystectomy. 3. TURP.  4. Pacemaker placement, as stated.  5. Partial colon resection, as stated.    OUTPATIENT MEDICATIONS:  1. Aspirin 81 mg.  2. Centrum Silver MVI once a day.  3. Nadolol 20 mg once a day. 4. Plavix 75 mg a day.   ALLERGIES: He is allergic to statins and tape.   REVIEW OF SYSTEMS: Per admission History and Physical.   PHYSICAL EXAMINATION:  GENERAL: He is a 81 year old Caucasian male in no acute distress. He refuses to allow the examiner to check him.   HEENT: Normocephalic, atraumatic. Eyes are anicteric.   HEART: Regular rate and rhythm without rub or gallop.   LUNGS: Clear.   ABDOMEN: Soft, nontender, nondistended. Bowel sounds are positive and normoactive.   NEUROLOGICAL:  Grossly intact cranial nerves.   LABORATORY, DIAGNOSTIC AND RADIOLOGICAL DATA: Glucose on admission of 204, BUN 28, creatinine 1.24, sodium 138, potassium 4.2, chloride 104, bicarbonate 25, calcium 8.1, lipase 238. Hepatic profile with a total  protein of 5.8, albumin of 2.9, bilirubin 0.4, alkaline phosphatase 85, AST 21, ALT 17. His hemogram shows a white count of 6.5, hemoglobin and hematocrit of 10.4/30.0, platelet count of 149, MCV is 99. He has been typed and screened. His INR is 1.0 with a pro time of 13.8, activated PTT of 33.4.   ASSESSMENT: Rectal bleeding in the setting of previous history of similar episode as well as use of anticoagulants. The  patient was checked in the Emergency Room and, per report, there was maroon-colored bloody effluent. Differential diagnosis would include diverticular, AVMs versus internal hemorrhoidal bleeding. However, the drop of his hemoglobin would indicate likely the first. He has had no repeat bleeding since this morning.   RECOMMENDATIONS:  1. Should the patient show that he is having some repeat bleeding or fresher episode of bleeding, I would recommend a tagged bleeding scan. If that is positive, I would then obtain a Vascular consult for consideration of possible microembolism treatment. There is apparently some difficulty in the past in regards to Anesthesia for this patient due to both his cardiac history as well as his dementia history, per the patient's wife. She has requested if there are any procedures that need to be done of a nonurgent nature she would prefer to have all that done at [facility] and requests that she be transferred there, if needed.  2. Recommend that consult be done with Cardiology in regards to the cardiac status and concern for continued anticoagulation in a patient with GI bleeding.  3. Recommend serial hemoglobin once every 8 to 12 hours, transfuse as needed.   We will follow with you.   ____________________________ Lollie Sails, MD mus:cbb D: 09/17/2012 16:11:11 ET T: 09/17/2012 16:40:33 ET JOB#: [id number]  cc: Lollie Sails, MD, <Dictator> Lollie Sails MD ELECTRONICALLY SIGNED 10/11/2012 11:20

## 2015-03-31 NOTE — Consult Note (Signed)
Chief Complaint:   Subjective/Chief Complaint patietn seen this am before 8am, and again this evening.  hemodynamically stable toleratinf liquids. This am denied any n or abdominal pain, no bm or repeat bleeding overnight.   This evening would not allow me to talk to him or examine him.   VITAL SIGNS/ANCILLARY NOTES: **Vital Signs.:   08-Oct-13 06:01   Vital Signs Type Routine   Temperature Temperature (F) 97.9   Celsius 36.6   Temperature Source Oral   Pulse Pulse 83   Respirations Respirations 18   Systolic BP Systolic BP 155   Diastolic BP (mmHg) Diastolic BP (mmHg) 77   Mean BP 103   Pulse Ox % Pulse Ox % 97   Pulse Ox Activity Level  At rest   Oxygen Delivery Room Air/ 21 %    13:13   Vital Signs Type Routine   Temperature Temperature (F) 97.4   Celsius 36.3   Temperature Source oral   Pulse Pulse 91   Respirations Respirations 18   Systolic BP Systolic BP 127   Diastolic BP (mmHg) Diastolic BP (mmHg) 74   Mean BP 91   Pulse Ox % Pulse Ox % 97   Pulse Ox Activity Level  At rest   Oxygen Delivery Room Air/ 21 %   Brief Assessment:   Cardiac Regular    Respiratory clear BS    Gastrointestinal details normal Soft  Nontender  Nondistended  No masses palpable  Bowel sounds normal  No rebound tenderness    Additional Physical Exam exam from this am   Lab Results: Routine Chem:  08-Oct-13 04:31    Glucose, Serum  104   BUN 15   Creatinine (comp) 0.74   Sodium, Serum 142   Potassium, Serum 3.9   Chloride, Serum  110   CO2, Serum 24   Calcium (Total), Serum  7.9   Anion Gap 8   Osmolality (calc) 284   eGFR (African American) >60   eGFR (Non-African American) >60 (eGFR values <60mL/min/1.73 m2 may be an indication of chronic kidney disease (CKD). Calculated eGFR is useful in patients with stable renal function. The eGFR calculation will not be reliable in acutely ill patients when serum creatinine is changing rapidly. It is not useful in  patients on dialysis.  The eGFR calculation may not be applicable to patients at the low and high extremes of body sizes, pregnant women, and vegetarians.)  Routine Hem:  08-Oct-13 04:31    Hemoglobin (CBC)  9.0 (Result(s) reported on 18 Sep 2012 at 05:07AM.)    10:44    Hemoglobin (CBC)  9.1 (Result(s) reported on 18 Sep 2012 at 11:06AM.)   Assessment/Plan:  Assessment/Plan:   Assessment 1) hematochezia, likely diverticular.   stable not recurrent.  2) cardiovascular disease, appreciate consult by Dr Gollan.    Plan 1) continue to hold anticoagulants. no plans for luminal evaluation.  2) will advance diet to low residue in am, if tolerated could go home with GI ( either his regular GI at [place] or myself) and cardiology fu.   Electronic Signatures: Skulskie, Martin (MD)  (Signed 08-Oct-13 20:00)  Authored: Chief Complaint, VITAL SIGNS/ANCILLARY NOTES, Brief Assessment, Lab Results, Assessment/Plan   Last Updated: 08-Oct-13 20:00 by Skulskie, Martin (MD) 

## 2015-03-31 NOTE — Op Note (Signed)
PATIENT NAME:  [name] Rich, [name] Rich MR#:  [id number] DATE OF BIRTH:  1932-11-26  DATE OF PROCEDURE:  08/14/2012  PREOPERATIVE DIAGNOSES:  1. Benign prostatic hypertrophy with median lobe formation.  2. Urinary retention.   POSTOPERATIVE DIAGNOSES:  1. Benign prostatic hypertrophy with medial lobe formation.  2. Urinary retention.   PROCEDURE: Transurethral resection of the prostate.   SURGEON: Edrick Oh, M.D.   ANESTHESIA: Spinal.   INDICATIONS: The patient is a 81 year old gentleman who has been noted to have progressively increasing bladder volume consistent with chronic urinary retention. He has a large median lobe component to his prostate. He has multiple other health issues with significant risk for undergoing surgery, especially with the lack of anticoagulant.  Extensive discussion has been undertaken with his family. He presents today for this purpose.   DESCRIPTION OF PROCEDURE: After informed consent was obtained, the patient was taken to the operating room and placed in the dorsal lithotomy position under spinal anesthesia with adequate levels. The patient was then prepped and draped in the usual standard fashion. The saline resectoscope sheath was placed utilizing the visual obturator without difficulty. No significant urethral abnormalities were appreciated. Upon entering the prostatic fossa, moderate bilobar hypertrophy was noted with prominent median lobe tissue with intravesical protrusion and bladder base elevation. Upon entering the bladder, the mucosa was inspected in its entirety. Bilateral ureteral orifices were identified. They were an adequate distance from the bladder neck region. Prominent trabeculation was noted throughout with multiple small diverticula and cellules. No other bladder lesions were appreciated. The saline resectoscope loop was then placed. Resection was begun of the median lobe tissue. The bulk of the tissue was from the median lobe. Lesser lateral lobe tissue  was appreciated as the resection progressed. The resection continued from the bladder neck to the level of the verumontanum. Additional bladder neck tissue was taken down to the level of the bladder base. Some lateral lobe tissue was resected. Minimal bleeding was encountered. Once the bulk of the resection was performed the chips were irrigated from the bladder. These were collected and will be sent for pathology analysis. The button electrode was then utilized to facilitate removal of additional prostate base tissue and some lateral lobe tissue. It was then utilized for cauterization. Once adequate hemostasis was obtained, the resectoscope was removed. A 22 French three-way Foley catheter was placed to gravity drainage utilizing the catheter guide. There were no problems or issues with catheter placement.  It was noted to irrigate easily. The catheter was placed to gravity drainage and to continuous bladder irrigation. The patient was returned to the supine position. He was then taken to the recovery room in stable condition. There were no problems or complications. The patient tolerated the procedure well.      ____________________________ Denice Bors. Jacqlyn Larsen, MD bsc:bjt D: 08/14/2012 14:48:09 ET T: 08/14/2012 15:21:36 ET JOB#: [id number]  cc: Denice Bors. Jacqlyn Larsen, MD, <Dictator> Denice Bors Davarion Cuffee MD ELECTRONICALLY SIGNED 08/16/2012 11:54

## 2015-03-31 NOTE — Consult Note (Signed)
Chief Complaint:   Subjective/Chief Complaint Patient seen and examined.  Patient marginally cooperative with exam.  No repeat bleeding since this am.  No abdominal pain.  DDX includes diverticular bleedign avm, or anal outlet bleeding. Hemodynamically stable.  Patietn on asa and plavix as outpatient.  Serial hgb, transfuse as needed.  If there is recurrent lbeeding recommend bleeding scan and if that is positive, vascular consult for consideration of microembolization.  Recommend consulting his primary cardiologists in regard to management of anticoagulation during this episode. Patients wife has requested that if there is any proceedure needed requiring sedation, she would prefer to go to [facility] where all his other physicians are.  Will follow with you.   VITAL SIGNS/ANCILLARY NOTES: **Vital Signs.:   07-Oct-13 12:36   Vital Signs Type Routine   Temperature Temperature (F) 97.4   Celsius 36.3   Temperature Source Oral   Pulse Pulse 61   Respirations Respirations 18   Systolic BP Systolic BP 309   Diastolic BP (mmHg) Diastolic BP (mmHg) 69   Mean BP 88   Pulse Ox % Pulse Ox % 99   Pulse Ox Activity Level  At rest   Oxygen Delivery Room Air/ 21 %   Electronic Signatures for Addendum Section:  Loistine Simas (MD) (Signed Addendum 07-Oct-13 16:19)  Discussed with Dr Fritzi Mandes.   Electronic Signatures: Loistine Simas (MD)  (Signed 07-Oct-13 16:18)  Authored: Chief Complaint, VITAL SIGNS/ANCILLARY NOTES   Last Updated: 07-Oct-13 16:19 by Loistine Simas (MD)

## 2015-03-31 NOTE — H&P (Signed)
PATIENT NAME:  [name] Rich, [name] Rich MR#:  [id number] DATE OF BIRTH:  1932-02-23  DATE OF ADMISSION:  09/17/2012  PRIMARY CARE PHYSICIAN: Viviana Simpler, MD  PRIMARY GASTROENTEROLOGIST: Dr. Sharlett Iles - [place]  CHIEF COMPLAINT: Rectal bleed since yesterday.   HISTORY OF PRESENT ILLNESS: [name] Rich is a 81 year old Caucasian gentleman who has dementia, history of ischemic cardiomyopathy, colon cancer, and hyperlipidemia who comes to the emergency room accompanied by his wife and son with the above-mentioned chief complaint. Per wife who gives most of the history, since the patient has dementia, the patient told his wife this morning that he had a large amount of blood in the stool last p.m. and this morning. He was brought to the emergency room, did have a rectal exam done by the emergency room physician, and did have dark-maroon blood in the rectal vault. The patient's hemoglobin was 14.1 in July 2013; today it is 10.4. The patient is on aspirin and Plavix due to his history of coronary artery disease status post coronary artery bypass graft. He is being admitted for further evaluation and management. Hemodynamically he appears stable.   PAST MEDICAL HISTORY:  1. Ischemic cardiomyopathy, unknown ejection fraction at this time.  2. Status post pacemaker placement.  3. Colon cancer status post colon resection and postoperative colonoscopy was stable, per patient's wife. Last colonoscopy was done two years ago by Dr. Sharlett Iles, gastroenterology, at [facility].  4. Dementia.  5. Hyperlipidemia.  6. Benign prostatic hypertrophy status post TURP by Dr. Jacqlyn Larsen, about a month ago.  7. Gastroesophageal reflux disease.  8. Coronary artery disease status post coronary artery bypass graft.   PAST SURGICAL HISTORY:  1. Cholecystectomy.  2. CABG.  3. TURP. 4. Pacemaker placement.  5. Colon surgery for colon cancer.  ALLERGIES: Statins and tape.   MEDICATIONS:  1. Aspirin 81 mg daily.  2. Centrum Silver  1 p.o. daily.  3. Nadolol 20 mg daily.  4. Plavix 75 mg daily.   FAMILY HISTORY: Positive for hypertension.   REVIEW OF SYSTEMS: CONSTITUTIONAL: No fever, fatigue, or weakness. EYES: No blurred or double vision. ENT: No tinnitus, ear pain, or hearing loss. RESPIRATORY: No cough, wheeze, or hemoptysis. CARDIOVASCULAR: No orthopnea, dyspnea, or chest pain. PULMONARY: No cough, hemoptysis, shortness of breath, or chronic obstructive pulmonary disease. GASTROINTESTINAL: Positive for rectal bleed. Positive for gastroesophageal reflux disease. No abdominal pain. GU: No dysuria or hematuria. ENDOCRINE: No polyuria or nocturia. HEMATOLOGY: Positive for anemia. SKIN: No acne or rash. MUSCULOSKELETAL: Positive for arthritis. NEUROLOGIC: Positive for dementia. PSYCH: No depression. Positive for dementia.  PHYSICAL EXAMINATION:   GENERAL: The patient is awake, alert, and oriented x2. He appears confused due to his dementia, repeatedly telling to get out of the emergency room.  VITALS: Afebrile, pulse 67, blood pressure 113/55, and saturation 98% on room air.   HEENT: Atraumatic, normocephalic. Pupils are equal, round, and reactive to light and accommodation. Extraocular movements intact. Oral mucosa is moist.   NECK: Supple. No JVD. No carotid bruit.   RESPIRATORY: Clear to auscultation bilaterally. No rales, rhonchi, respiratory distress, or labored breathing.   CARDIOVASCULAR: Both the heart sounds are normal. Rate and rhythm is regular. PMI is not lateralized. Chest is nontender.   EXTREMITIES: Good pedal pulses. Good femoral pulses. No lower extremity edema.   ABDOMEN: Soft, benign, and nontender. No organomegaly. Positive bowel sounds.  NEURO: Grossly intact cranial nerves II through XII. No motor or sensory deficit. The patient does have dementia and appears to be confused.  RESULTS: Hemoglobin and hematocrit is 10.4 and 30.0, white count is 6.5, and platelet count 149. Lipase 238. Glucose  204, BUN 28, creatinine 1.24, sodium 138, potassium 4.2, chloride 104, bicarbonate 25, and calcium 8.1 LFTs within normal limits. Albumin 2.9. PT-INR 13.8 and 1.   EKG shows electronic ventricular pacemaker changes.   ASSESSMENT AND PLAN: 81 year old [name] Rich with history of colon cancer status post resection and coronary artery disease status post coronary artery bypass graft with history of ischemic cardiomyopathy status post pacemaker placement who came in with rectal bleed that started since yesterday. He is being admitted with:  1. Rectal bleed/lower GI bleed. The patient came in with acute onset rectal bleed, which started yesterday. He denies any abdominal pain, fever. He appears hemodynamically stable. No orthostasis noted. His hemoglobin has dropped down from baseline of 14 to 10.3. We will admit the patient to a regular medical floor, continue IV fluids, and give clear liquid diet. Hold the patient's aspirin and Plavix. I will discuss with Dr. Vira Agar. The patient has history of colon cancer. Last colonoscopy was done about two years ago, per wife. No acute abnormality was noted. We will monitor hemoglobin and hematocrit every eight hours and transfuse as needed. Currently the patient is not actively bleeding. Appears this could be more likely diverticular bleed, however, in the setting of history of colon cancer we need to consider that as a differential as well.  2. Ischemic cardiomyopathy with history of pacemaker, ejection fraction unknown. We will hold off on aspirin and Plavix and hold nadolol given relative low blood pressure.  3. History of colon cancer status post resection and chemotherapy. Follows with Dr. Sharlett Iles with [facility].  4. Hyperlipidemia. Not on any medications. The patient has allergy to statins.  5. Recent TURP for benign prostatic hypertrophy last month.  6. Dementia, appears advanced.  7. Deep vein thrombosis prophylaxis. We will do SCDs and TEDs. No  antiplatelet agents given GI bleed.   Further work-up according to the patient's clinical course. Hospital admission plan was discussed with the patient's wife and son who are agreeable to it.   TIME SPENT: 50 minutes.  ____________________________ Hart Rochester Posey Pronto, MD sap:slb D: 09/17/2012 12:04:34 ET T: 09/17/2012 12:18:11 ET JOB#: [id number]  cc: Davie Sagona A. Posey Pronto, MD, <Dictator> Venia Carbon, MD Dr. Sharlett Iles [facility] Derik Fults Hulda Humphrey MD ELECTRONICALLY SIGNED 09/19/2012 9:29

## 2015-03-31 NOTE — Consult Note (Signed)
General Aspect [name] Rich is a 81 yo male with history of CAD, status post CABG, ischemic cardiomyopathy, chronic systolic CHF, complete heart block, status post pacemaker/ICD, HL, AAA, colon cancer, status post resection, previous GI bleed, presenting with rectal bleeding.   Per the patients wife,  he had a large amount of blood in the stool two evenings ago and yesterday morning. He was brought to the emergency room, did have a rectal exam done by the emergency room physician, and did have dark-maroon blood in the rectal vault. The patient's hemoglobin was 14.1 in July 2013;on admission it is 10.4.  He has significant dementia.  At baseline, walks daily about a mile.  He denies chest pain, shortness of breath, syncope.  He denies orthopnea or PND, edema.  He is able to vacuum the room without stopping for chest pain or shortness of breath.  Cardiac Hx:  s/p DES to the S-OM in 2006.  LHC 12/2006:  mLAD occluded, both obtuse marginal branches occluded, dPDA 30%, PL branches 30%, ramus branch with 30%, RCA 20-30% multiple discrete lesions, LIMA to LAD patent, SVG to OM patent, stent in the mid body of the graft patent, SVG to diagonal patent, EF 45-50%.  Myoview 02/2010: EF 53%, inferoseptal and apical changes consistent with mild scar, no ischemia.  Last abdominal ultrasound 08/2010: Infrarenal fusiform AAA 3.1 x 3.2 cm.     Past Medical History   ???  CHF (congestive heart failure)     ???  Coronary artery disease         s/p CABG   ???  Ischemic cardiomyopathy     ???  Colon, diverticulosis     ???  GERD (gastroesophageal reflux disease)     ???  BPH (benign prostatic hyperplasia)         Dr. Jacqlyn Larsen   ???  Heart block         pacer/defib 6/06-----Dr. Caryl Comes   ???  AAA (abdominal aortic aneurysm)     ???  Hyperlipidemia     ???  Colon cancer  1993   ???  Hyperplastic colon polyp         Recurrent   ???  Hernia  2013   ???  Dementia of the Alzheimer's type    Present Illness . Allergies: ???   Atorvastatin         REACTION: confusion   ???  Simvastatin         REACTION: zombie       Social History   ???  Smoking status:  Former Smoker -- 1.0 packs/day       Types:  Cigarettes       Quit date:  03/13/1996   ???  Smokeless tobacco:  Never Used   ???  Alcohol Use:  Yes         rarely  PAST SURGICAL HISTORY:  1. Cholecystectomy.  2. CABG.  3. TURP. 4. Pacemaker placement.  5. Colon surgery for colon cancer.  ALLERGIES: Statins and tape.   MEDICATIONS:  1. Aspirin 81 mg daily.  2. Centrum Silver 1 p.o. daily.  3. Nadolol 20 mg daily.  4. Plavix 75 mg daily.   FAMILY HISTORY: Positive for hypertension.   Physical Exam:   GEN well developed, well nourished, no acute distress, thin    HEENT pink conjunctivae    NECK supple  No masses     RESP normal resp effort  clear BS     CARD Regular rate  and rhythm  No murmur     ABD denies tenderness  soft     LYMPH negative neck    EXTR negative edema    SKIN normal to palpation    NEURO motor/sensory function intact    PSYCH alert, poor insight   Review of Systems:   Subjective/Chief Complaint no complaints    General: No Complaints     Skin: No Complaints     ENT: No Complaints     Eyes: No Complaints     Neck: No Complaints     Respiratory: No Complaints     Cardiovascular: No Complaints     Gastrointestinal: No Complaints     Genitourinary: No Complaints     Vascular: No Complaints     Musculoskeletal: No Complaints     Neurologic: No Complaints     Hematologic: No Complaints     Endocrine: No Complaints     Psychiatric: No Complaints     Review of Systems: All other systems were reviewed and found to be negative     ROS Pt not able to provide ROS  poor historian     Medications/Allergies Reviewed Medications/Allergies reviewed    Lab Results:  Routine Chem:  08-Oct-13 04:31    Glucose, Serum  104   BUN 15   Creatinine (comp) 0.74   Sodium, Serum 142   Potassium,  Serum 3.9   Chloride, Serum  110   CO2, Serum 24   Calcium (Total), Serum  7.9   Anion Gap 8   Osmolality (calc) 284   eGFR (African American) >60   eGFR (Non-African American) >60 (eGFR values <33m/min/1.73 m2 may be an indication of chronic kidney disease (CKD). Calculated eGFR is useful in patients with stable renal function. The eGFR calculation will not be reliable in acutely ill patients when serum creatinine is changing rapidly. It is not useful in  patients on dialysis. The eGFR calculation may not be applicable to patients at the low and high extremes of body sizes, pregnant women, and vegetarians.)  Routine Hem:  08-Oct-13 04:31    Hemoglobin (CBC)  9.0 (Result(s) reported on 18 Sep 2012 at 05:07AM.)   EKG:   Interpretation EKG shows paced rhythm at 78 bpm    Statins: Other  Tape: Other  Vital Signs/Nurse's Notes:  **Vital Signs.:   08-Oct-13 06:01   Vital Signs Type Routine   Temperature Temperature (F) 97.9   Celsius 36.6   Temperature Source Oral   Pulse Pulse 83   Respirations Respirations 18   Systolic BP Systolic BP 1631  Diastolic BP (mmHg) Diastolic BP (mmHg) 77   Mean BP 103   Pulse Ox % Pulse Ox % 97   Pulse Ox Activity Level  At rest   Oxygen Delivery Room Air/ 21 %     Impression . ASSESSMENT AND PLAN:  1.  CAD Overall stable.  He can achieve 4 METs without anginal symptoms.   He does not require any further cardiac workup prior to his procedure (if GI procedure needed) stop Plavix and asa When appropriate (HCT stable), could restart asa 81 mg daily (hold plavix). This could be done in close follow up as an outpt wityh close following of HCT  2.  Complete heart block, status post pacemaker NO intervention is needed  3.  Ischemic cardiomyopathy Continue other outpt meds If he has procedure, he will need close attention to volume status in the peri-op period. His volume is currently stable without  diuretics.   Electronic  Signatures: Ida Rogue (MD)  (Signed 08-Oct-13 09:09)  Authored: General Aspect/Present Illness, History and Physical Exam, Review of System, Home Medications, Labs, EKG , Allergies, Vital Signs/Nurse's Notes, Impression/Plan   Last Updated: 08-Oct-13 09:09 by Ida Rogue (MD)
